# Patient Record
Sex: Male | Born: 1970 | Race: White | Hispanic: No | Smoking: Never smoker
Health system: Southern US, Community
[De-identification: ages and names within clinical notes are randomized; demographics above are authoritative.]

## PROBLEM LIST (undated history)

## (undated) DIAGNOSIS — F1120 Opioid dependence, uncomplicated: Secondary | ICD-10-CM

## (undated) DIAGNOSIS — J449 Chronic obstructive pulmonary disease, unspecified: Secondary | ICD-10-CM

## (undated) DIAGNOSIS — T148XXA Other injury of unspecified body region, initial encounter: Secondary | ICD-10-CM

## (undated) DIAGNOSIS — F17200 Nicotine dependence, unspecified, uncomplicated: Secondary | ICD-10-CM

## (undated) DIAGNOSIS — G43909 Migraine, unspecified, not intractable, without status migrainosus: Secondary | ICD-10-CM

## (undated) DIAGNOSIS — F1121 Opioid dependence, in remission: Secondary | ICD-10-CM

## (undated) DIAGNOSIS — Z6841 Body Mass Index (BMI) 40.0 and over, adult: Secondary | ICD-10-CM

## (undated) DIAGNOSIS — I10 Essential (primary) hypertension: Secondary | ICD-10-CM

## (undated) DIAGNOSIS — I890 Lymphedema, not elsewhere classified: Secondary | ICD-10-CM

## (undated) HISTORY — PX: APPENDECTOMY: SHX54

## (undated) HISTORY — PX: LEG SURGERY: SHX1003

---

## 2000-02-15 ENCOUNTER — Emergency Department (HOSPITAL_COMMUNITY): Admission: EM | Admit: 2000-02-15 | Discharge: 2000-02-15 | Payer: Self-pay | Admitting: Emergency Medicine

## 2000-02-15 ENCOUNTER — Encounter: Payer: Self-pay | Admitting: Emergency Medicine

## 2000-08-27 ENCOUNTER — Emergency Department (HOSPITAL_COMMUNITY): Admission: EM | Admit: 2000-08-27 | Discharge: 2000-08-27 | Payer: Self-pay | Admitting: Emergency Medicine

## 2000-08-28 ENCOUNTER — Encounter: Payer: Self-pay | Admitting: Emergency Medicine

## 2000-08-30 ENCOUNTER — Emergency Department (HOSPITAL_COMMUNITY): Admission: EM | Admit: 2000-08-30 | Discharge: 2000-08-30 | Payer: Self-pay | Admitting: Emergency Medicine

## 2001-01-02 ENCOUNTER — Emergency Department (HOSPITAL_COMMUNITY): Admission: EM | Admit: 2001-01-02 | Discharge: 2001-01-02 | Payer: Self-pay | Admitting: Emergency Medicine

## 2001-01-02 ENCOUNTER — Encounter: Payer: Self-pay | Admitting: Emergency Medicine

## 2001-01-06 ENCOUNTER — Emergency Department (HOSPITAL_COMMUNITY): Admission: EM | Admit: 2001-01-06 | Discharge: 2001-01-06 | Payer: Self-pay | Admitting: Emergency Medicine

## 2001-01-12 ENCOUNTER — Encounter: Payer: Self-pay | Admitting: Emergency Medicine

## 2001-01-12 ENCOUNTER — Emergency Department (HOSPITAL_COMMUNITY): Admission: EM | Admit: 2001-01-12 | Discharge: 2001-01-12 | Payer: Self-pay | Admitting: Emergency Medicine

## 2001-02-06 ENCOUNTER — Emergency Department (HOSPITAL_COMMUNITY): Admission: EM | Admit: 2001-02-06 | Discharge: 2001-02-06 | Payer: Self-pay | Admitting: Emergency Medicine

## 2001-03-21 ENCOUNTER — Emergency Department (HOSPITAL_COMMUNITY): Admission: EM | Admit: 2001-03-21 | Discharge: 2001-03-22 | Payer: Self-pay | Admitting: Emergency Medicine

## 2001-03-22 ENCOUNTER — Encounter: Payer: Self-pay | Admitting: Emergency Medicine

## 2001-04-08 ENCOUNTER — Emergency Department (HOSPITAL_COMMUNITY): Admission: EM | Admit: 2001-04-08 | Discharge: 2001-04-08 | Payer: Self-pay | Admitting: *Deleted

## 2001-04-11 ENCOUNTER — Emergency Department (HOSPITAL_COMMUNITY): Admission: EM | Admit: 2001-04-11 | Discharge: 2001-04-11 | Payer: Self-pay | Admitting: Emergency Medicine

## 2001-04-11 ENCOUNTER — Encounter: Payer: Self-pay | Admitting: Emergency Medicine

## 2001-04-14 ENCOUNTER — Ambulatory Visit (HOSPITAL_BASED_OUTPATIENT_CLINIC_OR_DEPARTMENT_OTHER): Admission: RE | Admit: 2001-04-14 | Discharge: 2001-04-14 | Payer: Self-pay | Admitting: Orthopedic Surgery

## 2002-12-31 ENCOUNTER — Emergency Department (HOSPITAL_COMMUNITY): Admission: AD | Admit: 2002-12-31 | Discharge: 2002-12-31 | Payer: Self-pay | Admitting: Family Medicine

## 2003-04-23 ENCOUNTER — Emergency Department (HOSPITAL_COMMUNITY): Admission: EM | Admit: 2003-04-23 | Discharge: 2003-04-23 | Payer: Self-pay | Admitting: Emergency Medicine

## 2003-07-04 ENCOUNTER — Inpatient Hospital Stay (HOSPITAL_COMMUNITY): Admission: EM | Admit: 2003-07-04 | Discharge: 2003-07-05 | Payer: Self-pay | Admitting: Emergency Medicine

## 2003-09-30 ENCOUNTER — Emergency Department (HOSPITAL_COMMUNITY): Admission: EM | Admit: 2003-09-30 | Discharge: 2003-09-30 | Payer: Self-pay | Admitting: Emergency Medicine

## 2003-10-31 ENCOUNTER — Emergency Department (HOSPITAL_COMMUNITY): Admission: EM | Admit: 2003-10-31 | Discharge: 2003-11-01 | Payer: Self-pay | Admitting: *Deleted

## 2003-11-12 ENCOUNTER — Emergency Department (HOSPITAL_COMMUNITY): Admission: EM | Admit: 2003-11-12 | Discharge: 2003-11-12 | Payer: Self-pay | Admitting: Emergency Medicine

## 2003-11-29 ENCOUNTER — Emergency Department (HOSPITAL_COMMUNITY): Admission: EM | Admit: 2003-11-29 | Discharge: 2003-11-29 | Payer: Self-pay | Admitting: Family Medicine

## 2003-12-31 ENCOUNTER — Emergency Department (HOSPITAL_COMMUNITY): Admission: EM | Admit: 2003-12-31 | Discharge: 2003-12-31 | Payer: Self-pay | Admitting: Emergency Medicine

## 2004-05-20 ENCOUNTER — Emergency Department (HOSPITAL_COMMUNITY): Admission: EM | Admit: 2004-05-20 | Discharge: 2004-05-20 | Payer: Self-pay | Admitting: Family Medicine

## 2004-06-02 ENCOUNTER — Emergency Department (HOSPITAL_COMMUNITY): Admission: EM | Admit: 2004-06-02 | Discharge: 2004-06-02 | Payer: Self-pay | Admitting: Family Medicine

## 2004-07-28 ENCOUNTER — Emergency Department (HOSPITAL_COMMUNITY): Admission: EM | Admit: 2004-07-28 | Discharge: 2004-07-28 | Payer: Self-pay | Admitting: Emergency Medicine

## 2004-07-29 ENCOUNTER — Emergency Department (HOSPITAL_COMMUNITY): Admission: EM | Admit: 2004-07-29 | Discharge: 2004-07-29 | Payer: Self-pay | Admitting: Emergency Medicine

## 2004-08-24 ENCOUNTER — Emergency Department (HOSPITAL_COMMUNITY): Admission: EM | Admit: 2004-08-24 | Discharge: 2004-08-24 | Payer: Self-pay | Admitting: Emergency Medicine

## 2004-09-03 ENCOUNTER — Emergency Department (HOSPITAL_COMMUNITY): Admission: EM | Admit: 2004-09-03 | Discharge: 2004-09-03 | Payer: Self-pay | Admitting: Emergency Medicine

## 2004-11-25 ENCOUNTER — Emergency Department (HOSPITAL_COMMUNITY): Admission: EM | Admit: 2004-11-25 | Discharge: 2004-11-25 | Payer: Self-pay | Admitting: Emergency Medicine

## 2005-01-06 ENCOUNTER — Emergency Department (HOSPITAL_COMMUNITY): Admission: EM | Admit: 2005-01-06 | Discharge: 2005-01-06 | Payer: Self-pay | Admitting: Emergency Medicine

## 2005-04-16 ENCOUNTER — Emergency Department (HOSPITAL_COMMUNITY): Admission: EM | Admit: 2005-04-16 | Discharge: 2005-04-16 | Payer: Self-pay | Admitting: Emergency Medicine

## 2005-04-29 ENCOUNTER — Emergency Department (HOSPITAL_COMMUNITY): Admission: EM | Admit: 2005-04-29 | Discharge: 2005-04-29 | Payer: Self-pay | Admitting: Pediatrics

## 2005-05-24 ENCOUNTER — Emergency Department (HOSPITAL_COMMUNITY): Admission: EM | Admit: 2005-05-24 | Discharge: 2005-05-24 | Payer: Self-pay | Admitting: Emergency Medicine

## 2006-04-22 ENCOUNTER — Emergency Department (HOSPITAL_COMMUNITY): Admission: EM | Admit: 2006-04-22 | Discharge: 2006-04-22 | Payer: Self-pay | Admitting: *Deleted

## 2006-05-10 ENCOUNTER — Emergency Department (HOSPITAL_COMMUNITY): Admission: EM | Admit: 2006-05-10 | Discharge: 2006-05-10 | Payer: Self-pay | Admitting: Emergency Medicine

## 2006-05-18 ENCOUNTER — Emergency Department (HOSPITAL_COMMUNITY): Admission: EM | Admit: 2006-05-18 | Discharge: 2006-05-18 | Payer: Self-pay | Admitting: Emergency Medicine

## 2006-07-19 ENCOUNTER — Emergency Department (HOSPITAL_COMMUNITY): Admission: EM | Admit: 2006-07-19 | Discharge: 2006-07-20 | Payer: Self-pay | Admitting: Emergency Medicine

## 2006-07-31 ENCOUNTER — Inpatient Hospital Stay (HOSPITAL_COMMUNITY): Admission: EM | Admit: 2006-07-31 | Discharge: 2006-08-03 | Payer: Self-pay | Admitting: Gastroenterology

## 2006-08-03 ENCOUNTER — Encounter (INDEPENDENT_AMBULATORY_CARE_PROVIDER_SITE_OTHER): Payer: Self-pay | Admitting: Internal Medicine

## 2006-09-30 ENCOUNTER — Emergency Department (HOSPITAL_COMMUNITY): Admission: EM | Admit: 2006-09-30 | Discharge: 2006-10-01 | Payer: Self-pay | Admitting: Emergency Medicine

## 2007-01-07 ENCOUNTER — Emergency Department (HOSPITAL_COMMUNITY): Admission: EM | Admit: 2007-01-07 | Discharge: 2007-01-07 | Payer: Self-pay | Admitting: Emergency Medicine

## 2007-04-24 IMAGING — CR DG CHEST 2V
2 series · 2 of 2 positions shown · non-contrast
Comparison: 11/29/03.

CLINICAL DATA: Cough.  
 CHEST - 2 VIEWS:

[w chest pa]
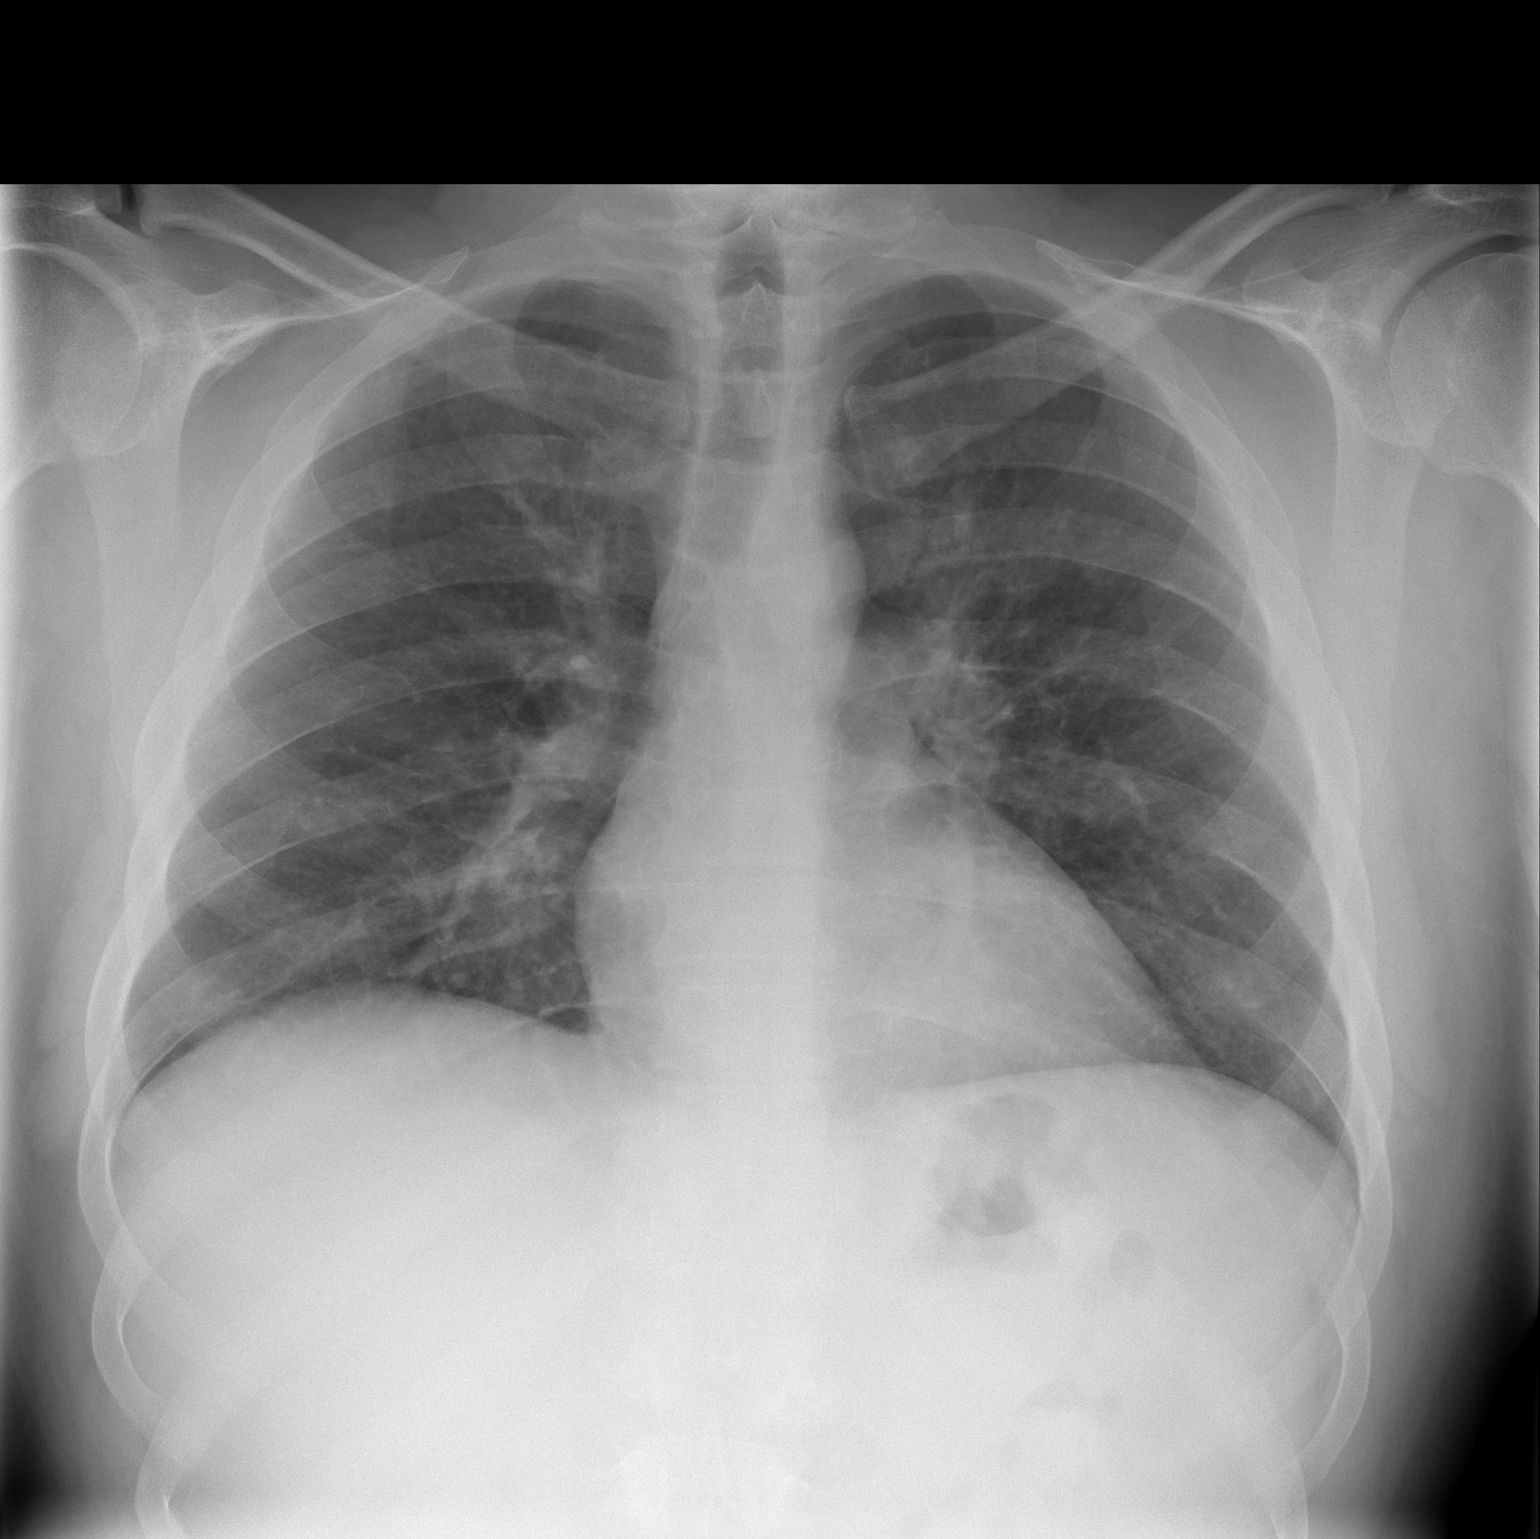

[w chest lat]
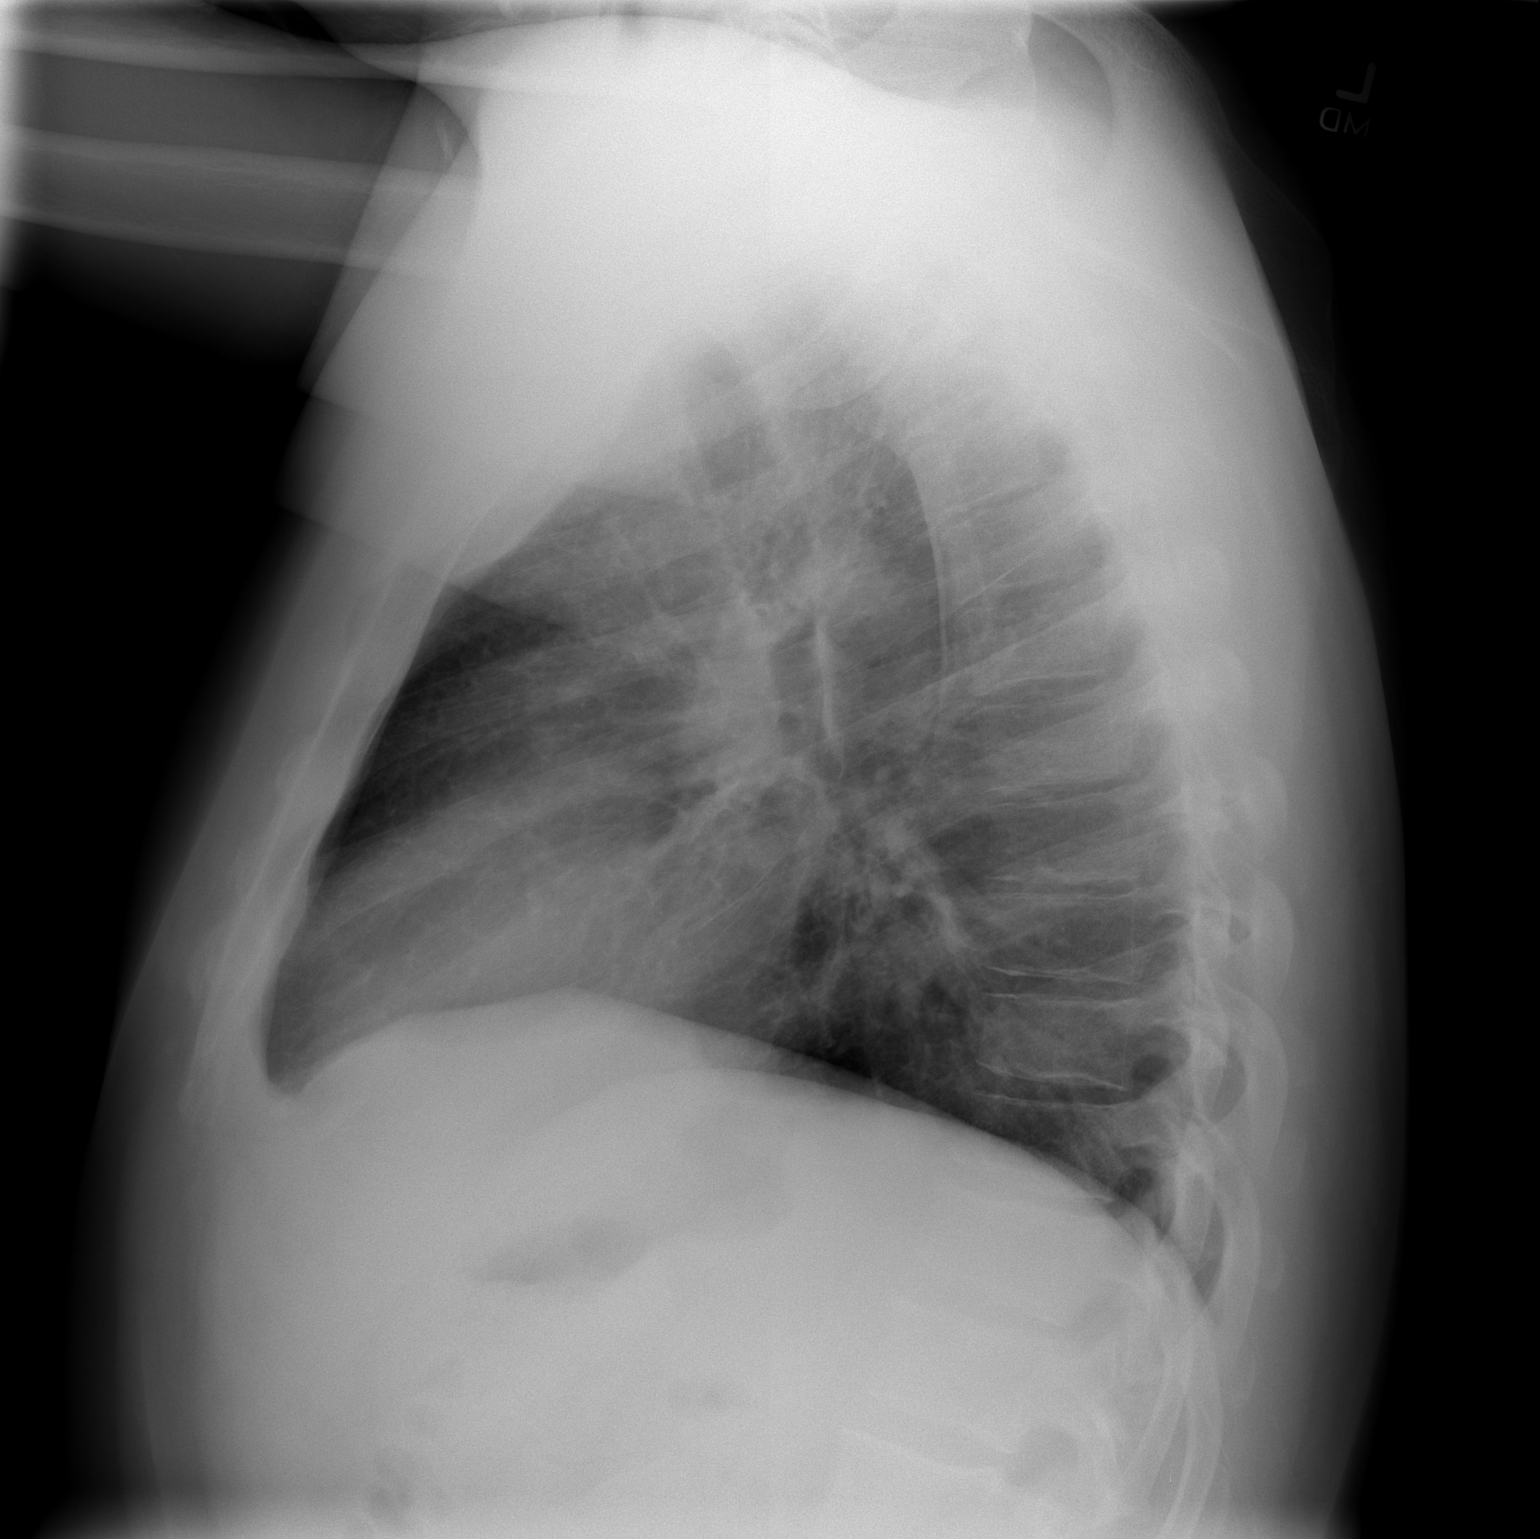

[2 of 2 positions shown; findings below may reference images not displayed]

FINDINGS: Left perihilar patchy densities are seen extending into the left upper lobe.  The lungs are otherwise clear and under inflated. The heart is normal in size.  No pneumothoraces or effusions are seen.
IMPRESSION: Left upper lobe pneumonia.  Follow-up until resolution is confirmed.

## 2007-04-28 ENCOUNTER — Emergency Department (HOSPITAL_COMMUNITY): Admission: EM | Admit: 2007-04-28 | Discharge: 2007-04-28 | Payer: Self-pay | Admitting: Emergency Medicine

## 2007-05-19 ENCOUNTER — Emergency Department (HOSPITAL_COMMUNITY): Admission: EM | Admit: 2007-05-19 | Discharge: 2007-05-19 | Payer: Self-pay | Admitting: Emergency Medicine

## 2007-05-23 ENCOUNTER — Inpatient Hospital Stay (HOSPITAL_COMMUNITY): Admission: EM | Admit: 2007-05-23 | Discharge: 2007-05-24 | Payer: Self-pay | Admitting: Emergency Medicine

## 2007-07-16 ENCOUNTER — Emergency Department (HOSPITAL_COMMUNITY): Admission: EM | Admit: 2007-07-16 | Discharge: 2007-07-16 | Payer: Self-pay | Admitting: Emergency Medicine

## 2007-10-16 ENCOUNTER — Emergency Department (HOSPITAL_COMMUNITY): Admission: EM | Admit: 2007-10-16 | Discharge: 2007-10-16 | Payer: Self-pay | Admitting: Podiatry

## 2007-12-09 ENCOUNTER — Emergency Department (HOSPITAL_COMMUNITY): Admission: EM | Admit: 2007-12-09 | Discharge: 2007-12-09 | Payer: Self-pay | Admitting: Emergency Medicine

## 2008-01-11 IMAGING — CT CT PELVIS W/O CM
2 of 4 series · 13 of 32 positions shown, 18 images · IV contrast (agent unspecified)
Comparison: 09/03/04.

CLINICAL DATA: Sudden onset of left flank, back and groin pain.
ABDOMEN CT WITHOUT CONTRAST:
TECHNIQUE: Multidetector CT imaging of the abdomen was performed following the standard protocol without IV contrast.  Kidney stone protocol with no oral or IV contrast.
TECHNIQUE: Multidetector CT imaging of the pelvis was performed following the standard protocol without IV contrast.

[Series 2: renal stone · axial · 0.70mm/px · z∈[-400,-110]mm · 5 of 88 slices shown, 10 images]
[im 15/88  soft-tissue]
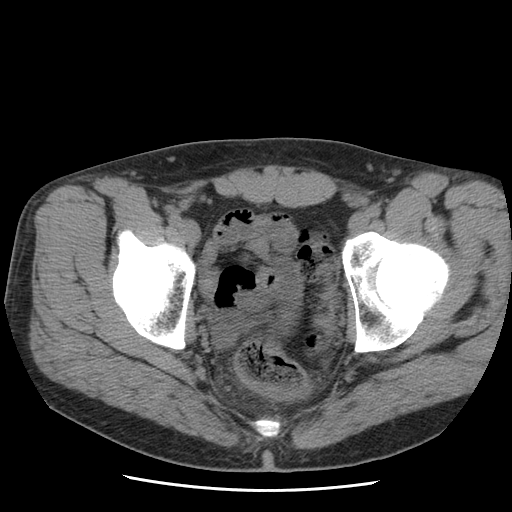
[im 15/88  bone]
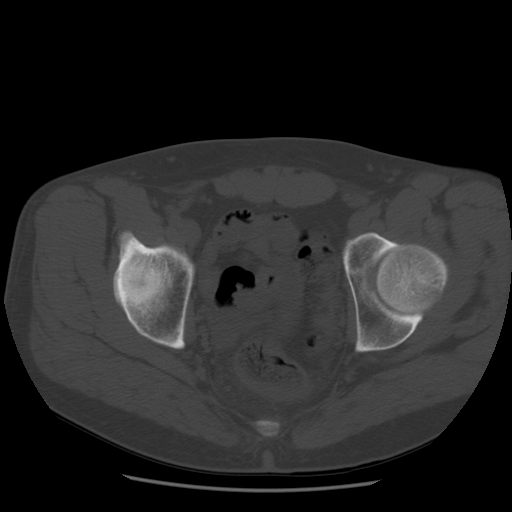
[im 30/88  soft-tissue]
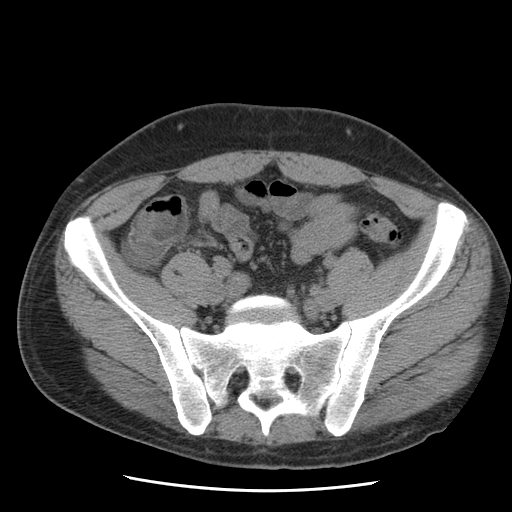
[im 30/88  lung]
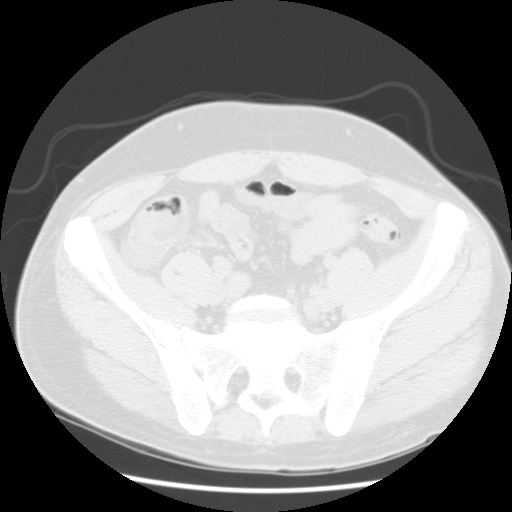
[im 44/88  soft-tissue]
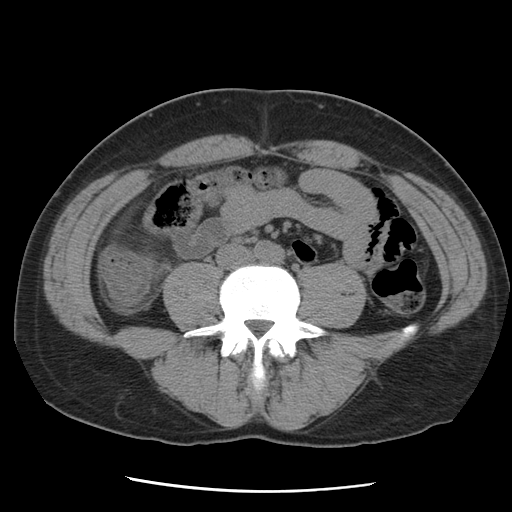
[im 44/88  lung]
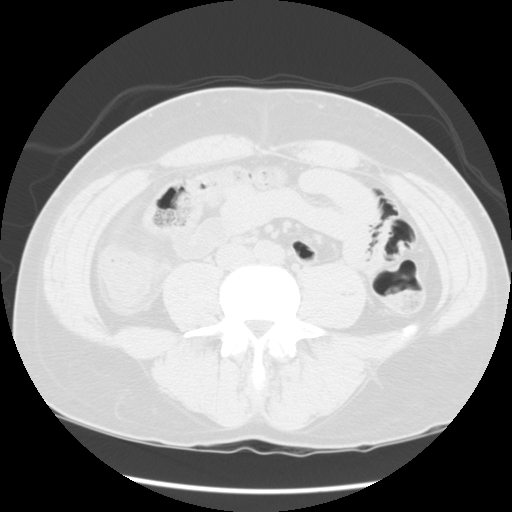
[im 59/88  soft-tissue]
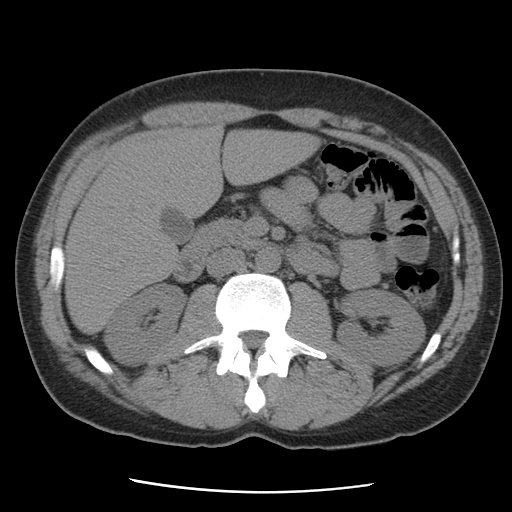
[im 59/88  lung]
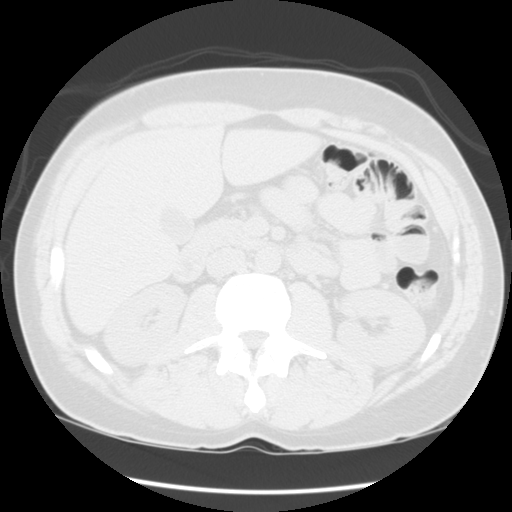
[im 73/88  soft-tissue]
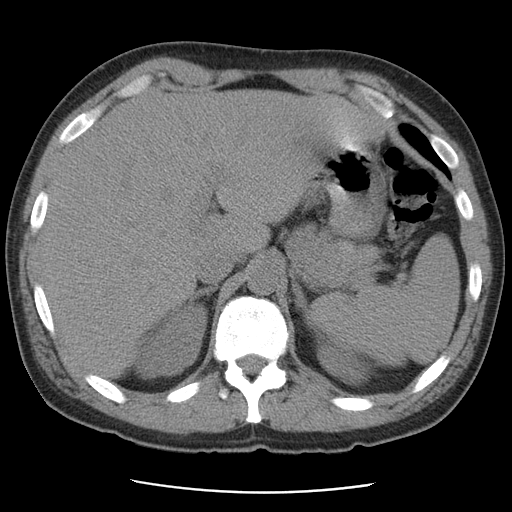
[im 73/88  lung]
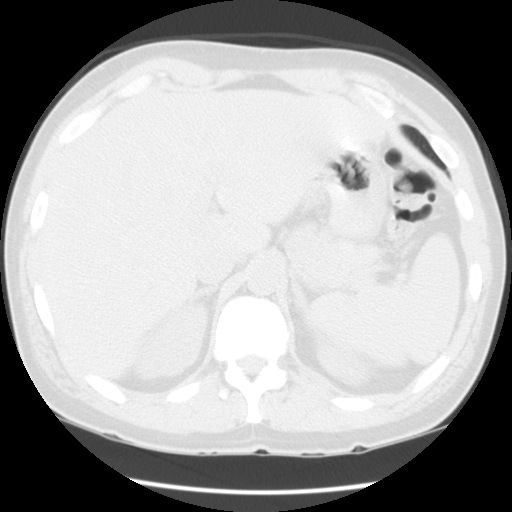

[Series 103: reformatted · sagittal · 0.94mm/px · 8 of 151 slices shown]
[im 14/151  soft-tissue]
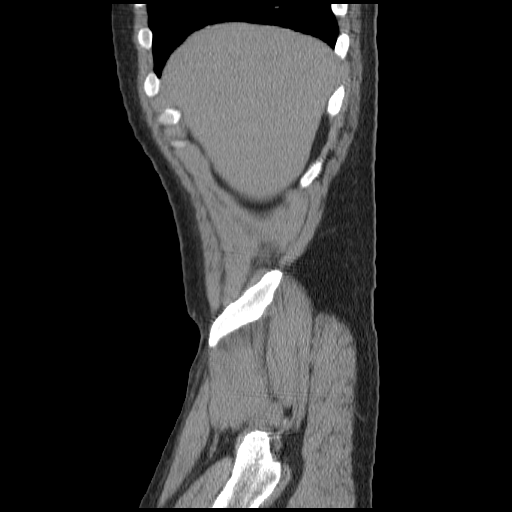
[im 28/151  soft-tissue]
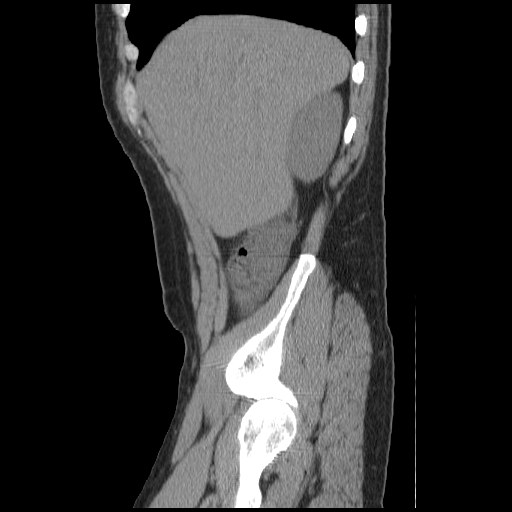
[im 55/151  soft-tissue]
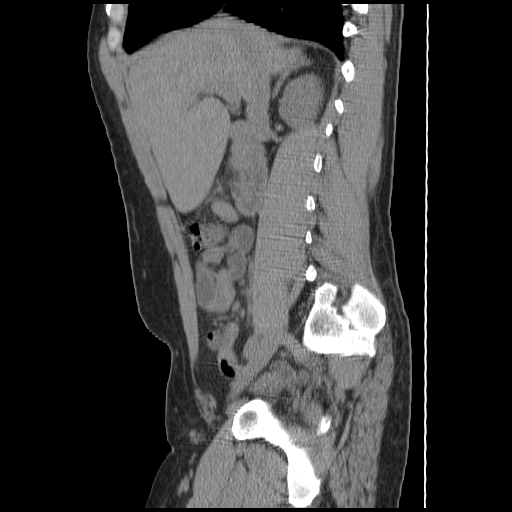
[im 69/151  soft-tissue]
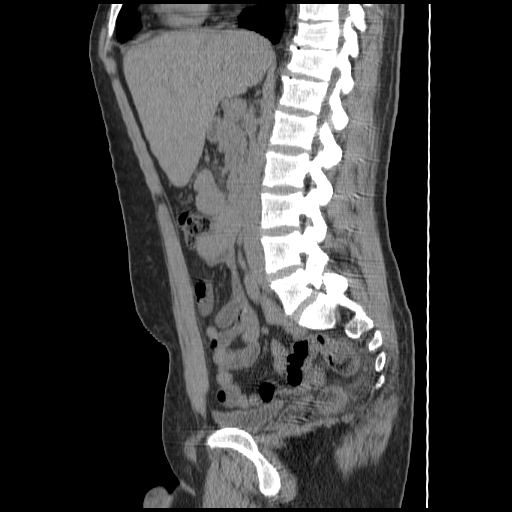
[im 82/151  soft-tissue]
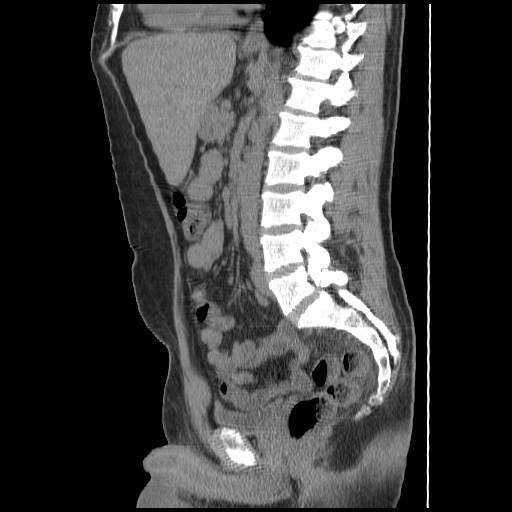
[im 96/151  soft-tissue]
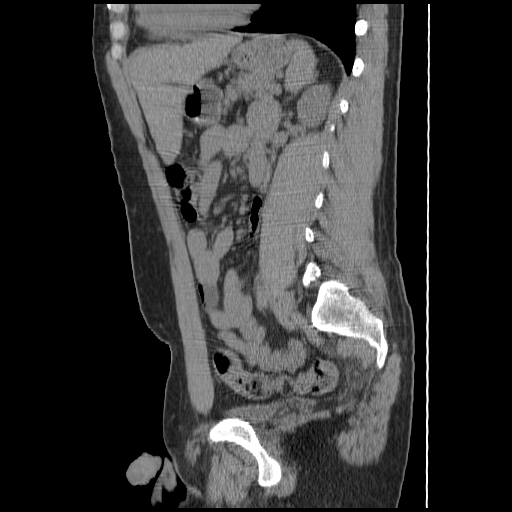
[im 123/151  soft-tissue]
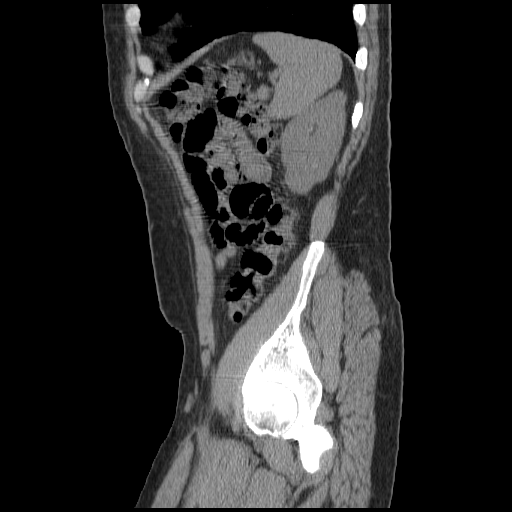
[im 137/151  soft-tissue]
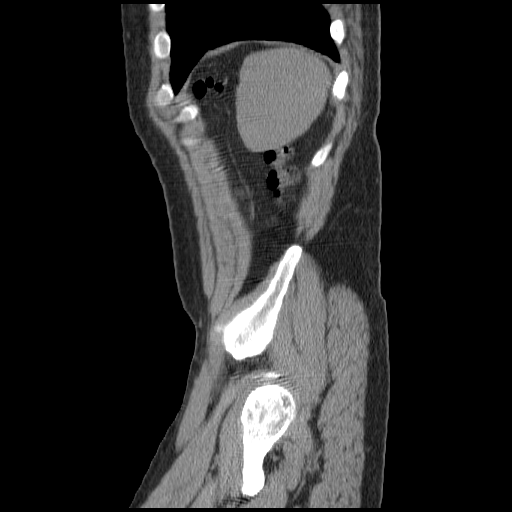

[13 of 32 positions shown; findings below may reference images not displayed]

FINDINGS: There are no renal calculi.  No hydronephrosis or perinephric stranding to suggest obstructive uropathy.  The left ureter is slightly more prominent than the right, and is more prominent than it was in September 2004, but this is a soft finding since the ureter can change in caliber due to peristalsis.  
There is some thickening and stranding around the colon, cecum and ascending colon.  This will be described in more detail in the exam of the pelvis.  No calcified gallstones.  Other viscera are unremarkable given the limitations of scanning without oral or IV contrast.
IMPRESSION: 1.  No evidence for urinary tract calculi or acute obstruction.  The left ureter is slightly full compared to the right.
2.  There does appear to be some inflammation of the right colon.  See report below.
PELVIS CT WITHOUT CONTRAST:
FINDINGS: No ureteral stone or obstruction.  No bladder calculi.  The mild fullness of the left ureter raises the question as to whether the patient may have passed a stone, but there are no current obstructing stones.  No inguinal hernia or adenopathy.
There is wall thickening and inflammation around the cecum and ascending colon suggesting inflammatory bowel disease.  It is possible that this is an artifact because there is no contrast in the bowel, but that would not explain the stranding of the pericolonic fat.  I do not see inflammatory changes in the remainder of the colon, but I would certainly question if this patient has inflammatory bowel disease of the right colon.  This needs careful clinical correlation.  It would appear to be an incidental finding, in light of the patient's left sided symptoms.
IMPRESSION: 1.  Mild fullness in the left ureter without visible stones.  Cannot rule out recent passage of a stone.  
2.  Inflammatory changes of the cecum and ascending colon ? see report.

## 2008-01-17 ENCOUNTER — Inpatient Hospital Stay (HOSPITAL_COMMUNITY): Admission: EM | Admit: 2008-01-17 | Discharge: 2008-01-19 | Payer: Self-pay | Admitting: Internal Medicine

## 2008-01-17 ENCOUNTER — Encounter: Payer: Self-pay | Admitting: Emergency Medicine

## 2008-01-17 ENCOUNTER — Ambulatory Visit: Payer: Self-pay | Admitting: Diagnostic Radiology

## 2008-02-06 ENCOUNTER — Emergency Department (HOSPITAL_BASED_OUTPATIENT_CLINIC_OR_DEPARTMENT_OTHER): Admission: EM | Admit: 2008-02-06 | Discharge: 2008-02-06 | Payer: Self-pay | Admitting: Emergency Medicine

## 2008-04-17 ENCOUNTER — Emergency Department (HOSPITAL_COMMUNITY): Admission: EM | Admit: 2008-04-17 | Discharge: 2008-04-17 | Payer: Self-pay | Admitting: Emergency Medicine

## 2008-05-02 ENCOUNTER — Emergency Department (HOSPITAL_BASED_OUTPATIENT_CLINIC_OR_DEPARTMENT_OTHER): Admission: EM | Admit: 2008-05-02 | Discharge: 2008-05-02 | Payer: Self-pay | Admitting: Emergency Medicine

## 2008-05-02 ENCOUNTER — Ambulatory Visit: Payer: Self-pay | Admitting: Interventional Radiology

## 2008-05-12 ENCOUNTER — Emergency Department (HOSPITAL_COMMUNITY): Admission: EM | Admit: 2008-05-12 | Discharge: 2008-05-12 | Payer: Self-pay | Admitting: Emergency Medicine

## 2008-05-25 ENCOUNTER — Emergency Department (HOSPITAL_COMMUNITY): Admission: EM | Admit: 2008-05-25 | Discharge: 2008-05-25 | Payer: Self-pay | Admitting: Emergency Medicine

## 2008-06-03 ENCOUNTER — Emergency Department (HOSPITAL_COMMUNITY): Admission: EM | Admit: 2008-06-03 | Discharge: 2008-06-03 | Payer: Self-pay | Admitting: Emergency Medicine

## 2008-06-05 ENCOUNTER — Emergency Department (HOSPITAL_COMMUNITY): Admission: EM | Admit: 2008-06-05 | Discharge: 2008-06-05 | Payer: Self-pay | Admitting: Emergency Medicine

## 2008-08-09 ENCOUNTER — Emergency Department (HOSPITAL_COMMUNITY): Admission: EM | Admit: 2008-08-09 | Discharge: 2008-08-10 | Payer: Self-pay | Admitting: Emergency Medicine

## 2008-09-03 ENCOUNTER — Emergency Department (HOSPITAL_COMMUNITY): Admission: EM | Admit: 2008-09-03 | Discharge: 2008-09-03 | Payer: Self-pay | Admitting: Emergency Medicine

## 2008-10-17 ENCOUNTER — Emergency Department (HOSPITAL_COMMUNITY): Admission: EM | Admit: 2008-10-17 | Discharge: 2008-10-17 | Payer: Self-pay | Admitting: Internal Medicine

## 2008-10-24 ENCOUNTER — Emergency Department (HOSPITAL_COMMUNITY): Admission: EM | Admit: 2008-10-24 | Discharge: 2008-10-24 | Payer: Self-pay | Admitting: Emergency Medicine

## 2010-05-14 LAB — URINALYSIS, ROUTINE W REFLEX MICROSCOPIC
Bilirubin Urine: NEGATIVE
Hgb urine dipstick: NEGATIVE
Ketones, ur: NEGATIVE mg/dL
Nitrite: NEGATIVE
Specific Gravity, Urine: 1.029 (ref 1.005–1.030)
Urobilinogen, UA: 0.2 mg/dL (ref 0.0–1.0)

## 2010-05-14 LAB — COMPREHENSIVE METABOLIC PANEL
ALT: 21 U/L (ref 0–53)
AST: 30 U/L (ref 0–37)
Albumin: 3.9 g/dL (ref 3.5–5.2)
Alkaline Phosphatase: 68 U/L (ref 39–117)
BUN: 23 mg/dL (ref 6–23)
Chloride: 110 mEq/L (ref 96–112)
Potassium: 4.2 mEq/L (ref 3.5–5.1)
Sodium: 143 mEq/L (ref 135–145)
Total Bilirubin: 0.4 mg/dL (ref 0.3–1.2)
Total Protein: 6.4 g/dL (ref 6.0–8.3)

## 2010-05-14 LAB — CBC
HCT: 37.2 % — ABNORMAL LOW (ref 39.0–52.0)
Hemoglobin: 12.2 g/dL — ABNORMAL LOW (ref 13.0–17.0)
MCHC: 34.1 g/dL (ref 30.0–36.0)
Platelets: 276 10*3/uL (ref 150–400)
RBC: 3.68 MIL/uL — ABNORMAL LOW (ref 4.22–5.81)
RDW: 13.9 % (ref 11.5–15.5)
RDW: 14.3 % (ref 11.5–15.5)
WBC: 12.2 10*3/uL — ABNORMAL HIGH (ref 4.0–10.5)

## 2010-05-14 LAB — RAPID URINE DRUG SCREEN, HOSP PERFORMED
Amphetamines: NOT DETECTED
Barbiturates: NOT DETECTED
Benzodiazepines: POSITIVE — AB
Tetrahydrocannabinol: NOT DETECTED

## 2010-05-14 LAB — POCT I-STAT, CHEM 8
BUN: 13 mg/dL (ref 6–23)
Chloride: 104 mEq/L (ref 96–112)
HCT: 37 % — ABNORMAL LOW (ref 39.0–52.0)
Potassium: 3.7 mEq/L (ref 3.5–5.1)

## 2010-05-14 LAB — DIFFERENTIAL
Basophils Absolute: 0 10*3/uL (ref 0.0–0.1)
Basophils Absolute: 0.1 10*3/uL (ref 0.0–0.1)
Basophils Relative: 0 % (ref 0–1)
Basophils Relative: 1 % (ref 0–1)
Eosinophils Absolute: 0 10*3/uL (ref 0.0–0.7)
Eosinophils Relative: 0 % (ref 0–5)
Lymphocytes Relative: 14 % (ref 12–46)
Monocytes Absolute: 0.1 10*3/uL (ref 0.1–1.0)
Monocytes Relative: 1 % — ABNORMAL LOW (ref 3–12)
Monocytes Relative: 8 % (ref 3–12)
Neutro Abs: 11.7 10*3/uL — ABNORMAL HIGH (ref 1.7–7.7)
Neutro Abs: 9.5 10*3/uL — ABNORMAL HIGH (ref 1.7–7.7)
Neutrophils Relative %: 77 % (ref 43–77)

## 2010-05-14 LAB — ETHANOL: Alcohol, Ethyl (B): <5 mg/dL (ref 0–10)

## 2010-06-18 NOTE — Discharge Summary (Signed)
NAMEHERMENEGILDO, CLAUSEN                 ACCOUNT NO.:  [id number]   MEDICAL RECORD NO.:  [id number]          PATIENT TYPE:  INP   LOCATION:  1438                         FACILITY:  [facility]   PHYSICIAN:  Altha Harm, MDDATE OF BIRTH:  Dec 19, 1970   DATE OF ADMISSION:  05/23/2007  DATE OF DISCHARGE:  05/24/2007                               DISCHARGE SUMMARY   DISCHARGE DISPOSITION:  Home.   FINAL DISCHARGE DIAGNOSES:  1. Community-acquired pneumonia possibly atypical.  2. Chronic pain.  3. Narcotic abuse.  4. Ground glass opacities in the lungs suggestive of atypical      pneumonia.  5. Stage 1 hypertension.   DISCHARGE MEDICATIONS:  1. Avelox 400 mg p.o. daily.  2. Fentanyl transdermal 15 mcg transdermally q.72 h.  3. Guaifenesin 600 mg p.o. b.i.d. p.r.n.  4. Albuterol MDI 2 puffs inhaled p.o. q.2 h. p.r.n.  5. Atrovent MDI 2 puffs q.4 h.   CONSULTATIONS:  None.   PROCEDURES:  None.   DIAGNOSTIC STUDIES:  Chest x-ray 2 view which shows bilateral pneumonia.  Impression:  Follow up imaging recommended to ensure complete  resolution.   STUDIES:  CT chest without contrast which shows multifocal bilateral  patchy ground-glass densities.  The appearance favors atypical  infection.  If this patient is immunocompromised this may represent PCP  pneumonia or other atypical etiologies.  Careful clinical correlation is  suggested.  Bacterial pneumonia is considered less favorable but not  excluded.  Borderline enlarged mediastinal lymph nodes as seen  previously.   ALLERGIES:  TYLENOL NO. 3 which causes hives.   CODE STATUS:  Full code.   PRIMARY CARE PHYSICIAN:  Unassigned.   CHIEF COMPLAINT:  Fevers, chills, and intractable cough.   HOSPITAL COURSE:  1. The patient was admitted and started on IV Avelox in addition to      nebulized beta agonist and Atrovent.  From the beginning of the      hospitalization the patient had no requirement for oxygen      whatsoever.  The  patient has been without oxygen since the entire      hospital stay.  The patient has been ambulatory on the floor of the      hospital without any oxygen and any exercise intolerance.  The      patient's oxygen sats are approximately 94% at rest and 93% with      activity as there is no change in his oxygen requirement or no      relative hypoxia with activity.  The patient has been receiving      nebulizers here in the hospital for wheezing.  However, the patient      has not had very much wheezing here in the hospital.  This can be      treated with albuterol and Atrovent inhalers MDI.  2. Pain.  The patient complains of pain.  He is placed on a fentanyl      patch.  The patient consistently rates his pain as an 8/10.      However, the patient is quite functional with his  pain performing      all his activities, ambulating well without any difficulty, eating      and drinking without any difficulty, and leaving the floor and      leaving the hospital without any difficulty.  Thus regardless of      the patient's level of pain he is quite functional with it, and the      Fentanyl patch appears to be mediating the pain to a level where      the patient can perform his activities without any difficulty.  3. Narcotic abuse.  The patient readily admits to narcotic abuse      stating that he takes methadone and oxycodone which he gets from      the streets.  During this hospitalization there was an incident      where the patient had a Fentanyl patch placed, and the Fentanyl      patch disappeared.  The nurses searched the entire room including      all the bed clothes and were unable to find the Fentanyl patch.      There is a suspicion from the nurses that the Fentanyl patch was      removed by the patient's girlfriend who was in the room with him.      I replaced the patient's Fentanyl patch in the effort to control      his pain, but informed the patient that if the Fentanyl patch       disappeared again he would not have it replaced on this admission      except for the reasonable and requisite time frame for a prescribed      Fentanyl patch.  The patient understood and agreed with the plan.  4. Ground glass opacities on chest x-ray.  The patient had a chest CT      done in August 2008, and there was a recommendation for a followup      three months later.  The patient never had a followup CT, and it      was done here while he was hospitalized.  The CT showed multifocal      bilateral patchy ground glass densities.  No further workup was      pursued on this hospitalization as the patient had no clinical      symptoms correlating to this.  However, I have advised the patient      that he needs to follow up with primary care physician on the      outside to possibly have a referral to pulmonology and to have this      evaluated by lung specialist preferably through bronchoscopy with      bronchoalveolar lavage and possibly biopsy if there is no      resolution of this.   PHYSICAL EXAMINATION:  GENERAL:  The patient's condition this morning is  stable.  He appears well and in no acute distress whatsoever.  VITAL SIGNS:  Temperature 98.7, heart rate 85, O2 sats 93% on room air,  ambulatory, respiratory rate 16, blood pressure 153/77.  LUNGS:  Clear to auscultation.  However, the patient recently received  breathing treatment.  There is no accessory muscle use with the  patient's breathing.  He has equal excursion bilaterally, breathing is  nonlabored.  CARDIOVASCULAR:  He has a normal S1, S2.  No murmurs, rubs or gallops  noted.  PMI is nondisplaced.  No heaves or thrills to palpation.  ABDOMEN:  Soft,  obese, nontender, nondistended.  No masses, no  hepatosplenomegaly noted.  LYMPHADENOPATHY:  There is no cervical, axillary or inguinal  lymphadenopathy noted.   Patient is eating and drinking well and ambulating without any  respiratory distress whatsoever.       Altha Harm, MD  Electronically Signed     MAM/MEDQ  D:  05/24/2007  T:  05/24/2007  Job:  [id number]

## 2010-06-18 NOTE — H&P (Signed)
[name], [name]                 ACCOUNT Nathaniel [name].:  [id number]   MEDICAL RECORD Nathaniel [name].:  [id number]          PATIENT TYPE:  INP   LOCATION:  1305                         FACILITY:  [facility]   PHYSICIAN:  Gardiner Barefoot, MD    DATE OF BIRTH:  December 05, 1970   DATE OF ADMISSION:  07/31/2006  DATE OF DISCHARGE:                              HISTORY & PHYSICAL   PRIMARY CARE PHYSICIAN:  Unassigned.   CHIEF COMPLAINT:  Cough and shortness of breath.   HISTORY OF PRESENT ILLNESS:  Mr. Nathaniel [name] is a 40 year old male with  history of substance abuse and recurrent episodes of shortness of breath  and clinical diagnoses of bronchitis and pneumonia.  The patient has had  frequent visits to the emergency room in regards to these episodes and,  in fact, has recently had a CT scan angio July 20, 2006 significant for  small right effusion and dense opacity at the right base that likely is  an inflammatory process but neoplasm not excluded.  The patient does  endorse a 30 pound weight loss that is not documented by his emergency  room visits but denies any recent night sweats or fevers.  The patient  was most recently treated for pneumonia with azithromycin, however, has  had Nathaniel [name] relief.  The patient's girlfriend does report that he has had  difficulty sleeping, unable to lie down because of shortness of breath.  He does report a nonproductive cough which has been the same over  several weeks.  The patient denies any chest pain and does report a  normal appetite.   PAST MEDICAL HISTORY:  None.   FAMILY HISTORY:  Nathaniel [name] lung disease.   SOCIAL HISTORY:  He smokes 2 packs per day, history of substance abuse  and patient does report he has been through rehab.  He denies any  current drug use.   ALLERGIES:  Nathaniel [name] known drug allergies.   MEDICATIONS:  None.   REVIEW OF SYSTEMS:  A complete 12 point review of systems was obtained  and was negative other than that as presented in the history of present  illness.   PHYSICAL EXAMINATION:  VITAL SIGNS:  Temperature 99.7, pulse 87.  Respirations 24.  Blood pressure is 119/66.  Oxygen saturation 93% on  room air and 96% on 2L nasal cannula.  GENERAL APPEARANCE:  The patient is awake, alert, is drowsy, has  received opioids as well as benzodiazepine in the emergency room.  CHEST:  Clear to auscultation bilaterally with good air entry and Nathaniel [name]  crackles noted.  The patient was examined after a nebulizer treatment.  HEART:  Has a regular rate and rhythm with Nathaniel [name] murmurs, rubs or gallops.  ABDOMEN:  Soft, nontender, nondistended with positive bowel sounds and  Nathaniel [name] hepatosplenomegaly.  EXTREMITIES:  With Nathaniel [name] cyanosis, clubbing or edema.   LABORATORY DATA:  Includes a white count of 13.2, hemoglobin 11.6,  platelet count 296,000, 85% neutrophils.  Sodium is 136, potassium 3.9,  chloride 99, bicarb 27, BUN 8, creatinine 0.72, glucose 86, AST 31, ALT  21.  Urinalysis negative.  ABG's 7.34/49/73/24.  CLINICAL DATA:  Chest x-ray with progressive perihilar and upper lobe  air space disease, pulmonary edema versus infection.  Right lower lobe  abnormality and adenopathy not well visualized on the chest x-ray.   ASSESSMENT/PLAN:  1. Hypoxia.  The patient does have mild hypoxia.  I am not clear if      this is pneumonia at this time as the patient has had a      nonproductive cough and Nathaniel [name] fever and Nathaniel [name] other significant findings      for pneumonia.  Rather, this process may be consistent with either      an early emphysema or chronic obstructive pulmonary disease.  Other      processes such as sarcoid disease, lung cancer or less likely      interstitial disease.  The patient likely needs to have further      evaluation with a chest CT scan to further evaluate his adenopathy      and potentially evaluation by pulmonology and potentially a      bronchoscopy for an etiology.  An infectious etiology is less      likely although certainly on the differential.  2. History  of substance abuse.  The patient does have a history of      using opioids as well as benzodiazepines and therefore will check a      urine drug screen on the urinalysis that was done prior to the      patient's medications.  Will hold off on any more opioids and will      give periodic doses of Ativan to avoid any withdrawal.  3. Case management.  The patient does not have a primary care      physician and has had multiple visits to the emergency room.  He      will need to be referred for a primary care physician, though he      does not have insurance.      Gardiner Barefoot, MD  Electronically Signed     RWC/MEDQ  D:  07/31/2006  T:  08/01/2006  Job:  [id number]

## 2010-06-18 NOTE — Discharge Summary (Signed)
NAMEGURTEJ, Nathaniel [name]                 ACCOUNT NO.:  [id number]   MEDICAL RECORD NO.:  [id number]          PATIENT TYPE:  INP   LOCATION:  1305                         FACILITY:  [facility]   PHYSICIAN:  Isidor Holts, M.D.  DATE OF BIRTH:  Oct 17, 1970   DATE OF ADMISSION:  07/31/2006  DATE OF DISCHARGE:  08/03/2006                               DISCHARGE SUMMARY   DISCHARGE DIAGNOSES:  1. Community-acquired pneumonia.  2. Chronic obstructive pulmonary disease exacerbation, secondary to #1      above.  3. Smoking history.  4. Mild respiratory failure, secondary to #1, 2 and 3 above.  5. Gastroesophageal reflux disease.  6. Lung abnormality seen on chest CT angiogram July 20, 2006.      Recommended followup in 3 months.   DISCHARGE MEDICATIONS:  1. Avelox 400 mg p.o. daily for 7 days from August 04, 2006.  2. Mucinex 600 mg p.o. b.i.d. for 7 days only.  3. Percocet 5/325 one p.o. p.r.n. q.8h.; a total of 15 pills have been      dispensed.  4. Protonix 40 mg p.o. daily.  5. Combivent inhaler 2 puffs t.i.d.  6. Prednisone 30 mg p.o. daily for 2 days, then 20 mg p.o. daily for 2      days, then 10 mg p.o. daily for 2 days, then stop.   PROCEDURE:  1. Chest x-ray dated July 24, 2006:  This showed progressive      perihilar/upper lobe airspace disease, possibly representing      pulmonary edema versus infectious infiltrate.  2. Chest x-ray dated August 03, 2006:  This showed resolution of      airspace disease in upper lobes.  3. Note, of special interest, chest CT angiogram dated July 20, 2006;      this showed suboptimal filling of the pulmonary arteries but no      obvious pulmonary thromboembolism, mild mediastinal and hilar      adenopathy, small right effusion and density at the right base.      This may simply represent an inflammatory process, although a      neoplasm is not entirely excluded. Followup CT in 3 to 6 months is      warranted.  4. Two-D echocardiogram dated August 03, 2006:  Report was still pending      at the time of this dictation.   CONSULTATIONS:  None.   ADMISSION HISTORY:  As in H&P note of July 31, 2006 dictated by Dr.  Staci Righter. However, in brief, this is a 40 year old male known  smoker, with history of opioid abuse, otherwise no significant past  medical history, who presents with cough and shortness of breath.  On  detailed history, it appears that the patient has had frequent visits to  the emergency department related to episodes of shortness of breath and  cough, and as a matter of fact, had a chest CT angiogram done on July 20, 2006 which shows some abnormal findings. For details, refer to  procedure list above. He represents on this occasion.  Chest x-ray  showed progressive  perihilar and upper lobe airspace disease. The  patient's initial ABGs were 7.34/49/73/49, bicarbonate 24. He was  admitted for the evaluation, investigation and management.   CLINICAL COURSE:  1. Community-acquired pneumonia.  For details of presentation, refer      to admission history above. However, differential diagnostic      considerations for patient's shortness of breath and cough inluded      the possibility of an infective process, i.e. pneumonia, versus      pulmonary edema. However, BNP was 30, effectively ruling out      cardiomyopathy/pulmonary edema. The patient also had cough      productive of brownish/yellowish phlegm. He was managed with      intravenous Avelox, bronchodilator nebulizers and Mucinex with      satisfactory clinical response. By August 03, 2006, the patient felt      very well indeed, had no pyrexia, and sputum production had      significantly diminished. Followup chest x-ray of August 03, 2006      showed complete resolution of upper lobe airspace disease.      Clinically, the patient's problem appeared to have been community-      acquired pneumonia. He was transitioned to oral Avelox and is      expected to complete a  10-day course of antibiotic treatment on      August 10, 2006.   1. Chronic obstructive pulmonary disease exacerbation.  The patient is      a quite heavy smoker. It is clear that COPD is likely contributory      to his shortness of breath. He has been counseled appropriately for      cigarette smoking. Nicoderm CQ patch was utilized during the course      of his hospitalization. He has, however, also been placed on      Combivent MDI and a tapering course of steroids. Clinically, he      responded quite dramatically to above measures.   1. Mild respiratory failure.  Presenting ABGs were as follows:  PH      7.34, pCO2 49, pO2 73, bicarbonate 24. These findings are      consistent with marked chronic respiratory failure secondary to      COPD with supradded community-acquired pneumonia, against      background of smoking history. It is likely that these parameters      will improve, provided that patient stop smoking and is compliant      with his bronchodilator medication. Repeat ABGs were done on August 03, 2006. and showed PH7.453; PCO2 33.0; PO2 79.0 on room air.   1. Gastroesophageal reflux disease.  During the course of his      hospitalization, patient complained of severe heartburn, i.e. on      August 02, 2006. This was readily addressed with protein-pump      inhibitor and p.r.n. Maalox with resolution of symptoms.   1. Smoking history. As mentioned above, the patient has been counseled      for this.   DISPOSITION:  The patient was considered sufficiently clinically  recovered and stable to be discharged in satisfactory condition on August 03, 2006 and has been discharged accordingly. He is also recommended to  return to work on August 10, 2006.   DIET:  No restrictions.   ACTIVITY:  As tolerated.   FOLLOWUP INSTRUCTIONS:  The patient at present has no primary M.D. He has been strongly  recommended to establish a primary M.D. for routine  and preventative care. He has been  supplied with appropriate  information.   SPECIMENS:  The patient is recommended to have followup chest CT scan in  3 months because of abnormality seen on chest CT angiogram on July 20, 2006. For details, refer to procedure list above. All of this has been  communicated to patient. He has verbalized understanding.     Isidor Holts, M.D.  Electronically Signed    CO/MEDQ  D:  08/03/2006  T:  08/03/2006  Job:  [id number]

## 2010-06-18 NOTE — H&P (Signed)
NAMEDAYVEN, LINSLEY NO.:  [id number]   MEDICAL RECORD NO.:  [id number]          PATIENT TYPE:  INP   LOCATION:  1438                         FACILITY:  [facility]   PHYSICIAN:  Lucita Ferrara, MD         DATE OF BIRTH:  [birth year]   DATE OF ADMISSION:  05/22/2007  DATE OF DISCHARGE:                              HISTORY & PHYSICAL   HISTORY OF PRESENT ILLNESS:  The patient a 40 year old Caucasian male  presents to [facility] with chief complaint of fevers,  chills and intractable cough with sputum with marine colored phlegm.  Patient is also wheezing .  The patient, of note, has a history of  recurrent community-acquired pneumonia, smokes two packs per day and has  not quit smoking.  The patient also has a history of lung abnormalities  seen on a CT angio July 20, 2006, recommended follow-up in 3 months.  Otherwise, review of systems is negative.  Although, his girlfriend  tells me the patient abuses prescription medications which he buys from  the street.  Apparently, he takes some 10-20 at a time.   PAST MEDICAL HISTORY:  1. Recurrent pneumonia.  2. Tobacco abuse.  3. Bronchitis and COPD.  4. Recurrent bilateral pneumonia.  5. CT scan finding of a lung abnormality recommended to get follow-up.  6. Polysubstance abuse.  7. History of hypoxemia and mild respiratory failure.   SOCIAL HISTORY:  Smokes two packs per day.  He is a heavy drinker, also  abuses drugs.  He is currently on methadone per his girlfriend.  He  takes street medications that he buys in the pill form.   ALLERGIES:  HE IS ALLERGIC TO CODEINE #3 WHERE HE GETS HIVES.   HOME MEDICATIONS:  Only significant for Vicodin.  He also takes  ibuprofen.   REVIEW OF SYSTEMS:  Otherwise, negative.   PHYSICAL EXAMINATION:  VITAL SIGNS:  Pulse is 93, respirations 18,  temperature 97.1.  HEENT:  Patient is normocephalic, atraumatic.  Sclerae anicteric.  NECK:  Supple, no JVD or carotid bruits.   Mucous membranes are dry.  CARDIOVASCULAR:  S1, S2, regular rate and rhythm, no murmurs, rubs,  clicks.  LUNGS:  Bilateral expiratory wheezes.  EXTREMITIES:  No clubbing, cyanosis or edema.  Patient is alert and  oriented x3.  Nerves II-XII grossly intact.   LABORATORY DATA:  He has a basic metabolic panel which is within normal  limits.  CBC shows white count of 15.6.  Chest x-ray shows bilateral  upper lobe pneumonia.   ASSESSMENT/PLAN:  A 40 year old with:  1. Bilateral upper lobe pneumonia leukocytosis.  Bilateral upper lobe      pneumonia is really recurrent.  Admit the patient to telemetry      floor.  Will get the ABGs.  Monitor pulse oximetry.  Start      antibiotics for community-acquired pneumonia coverage with      Zithromax and Rocephin.  Blood cultures.  Will go ahead and proceed      to repeat the CT scan of the chest to evaluate  the right-sided      density that the patient has had in the past.  Nebulizer treatments      of albuterol and Atrovent.  I will go ahead and place a PPD on him,      given recurrance and upper lobe prodominance.  The patient may need      a pulmonary medicine consultation during this admission, although I      do believe that most of his abnormalities in current disease state      is secondary to his tobacco abuse.  2. Polysubstance abuse mostly with opiate narcotics.  Will go ahead      and check a urine drug screen and blood alcohol level.  Will      definitely have to watch for withdrawal.  The patient states that      he takes methadone about 30 mg a day, and he takes 100 mg of street      oxycodone.  I will go ahead and put him on a fentanyl patch for now      to avoid withdrawal.  The patient actually states that he currently      is not drinking.  3. Gastroesophageal reflux disease.  Continue Protonix.  4. Smoking cessation counsling and nicotine patch.  5. Again, case management and referral to substance abuse counselor      during  this admission is prudent.      Lucita Ferrara, MD  Electronically Signed     RR/MEDQ  D:  05/23/2007  T:  05/23/2007  Job:  [id number]

## 2010-06-18 NOTE — H&P (Signed)
NAMEUNKNOWN, SCHLEYER                 ACCOUNT NO.:  [id number]   MEDICAL RECORD NO.:  [id number]          PATIENT TYPE:  INP   LOCATION:  1222                         FACILITY:  [facility]   PHYSICIAN:  Della Goo, M.D. DATE OF BIRTH:  07/09/1970   DATE OF ADMISSION:  01/17/2008  DATE OF DISCHARGE:                              HISTORY & PHYSICAL   PRIMARY CARE PHYSICIAN:  None.   CHIEF COMPLAINT:  Shortness of breath and wheezing.   HISTORY OF THE PRESENT ILLNESS:  This is a 40 year old male who presents  to the emergency department at the [facility] secondary to  complaints of worsening shortness of breath along with wheezing and  productive cough.  The patient reports worsening of his symptoms over  the past few days.  He denies having any fevers or chills.  He reports  coughing up a whitish sputum.  The patient has been admitted on several  occasions for similar symptoms.  He denies having any formal diagnosis  of COPD, emphysema, chronic bronchitis or asthma.  He does report  smoking two packs of cigarettes daily for 25+ years.   [facility]  and was treated with nebulizer treatments continuously without relief.  He was also placed on supplemental oxygen and administered IV steroid  therapy.  The patient was referred for admission secondary to  unremitting symptoms.   PAST MEDICAL HISTORY:  The past medical history significant for:  1. History of respiratory failure in the past.  2. History of bronchitis.  3. Recurrent pneumonia.  4. Tobacco abuse.  5. Polysubstance abuse.   MEDICATIONS:  The patient's medications at this time are none.   ALLERGIES:  THE PATIENT HAS AN ALLERGY TO CODEINE, WHICH CAUSES HIVES.   SOCIAL HISTORY:  The patient is a heavy smoker of two pack daily for 25  years.  Also the patient reports drinking occasionally one beer twice a  week; however, previously had a heavy alcohol  abuse history.  The  patient also has an illicit drug use history and history of  polysubstance abuse, and had attended rehabilitation therapy.  He  reports being clean for several months.   FAMILY HISTORY:  The family history is noncontributory.   REVIEW OF SYSTEMS:  On the review of systems the pertinents are as  mentioned above.  The patient denies having any nausea, vomiting,  diarrhea, constipation, changes in his appetite, weight loss, weight  gain, dysuria, syncope, lightheadedness, and weakness.  He does report  having a cough and chest discomfort associated with increased coughing.   PHYSICAL EXAMINATION:  The physical examination findings are:  GENERAL APPEARANCE:  This is a 40 year old well-nourished and well-  developed male who is agitated and in discomfort, but in no acute  distress.  VITAL SIGNS:  The vital signs are with a temperature of 97.8, blood  pressure 159/105 initially, heart rate 122, respirations 28, and O2 sat  96% on room air.  HEENT EXAMINATION:  Normocephalic and atraumatic head.  There is no  scleral icterus.  Pupils are equally round and react to light.  Extraocular movements are intact.  Funduscopic is benign.  Oropharynx is  clear.  NECK:  The neck is supple with full range of motion.  No thyromegaly,  adenopathy or jugulovenous distention.  HEART:  Cardiovascular - tachycardiac rate and rhythm.  No murmurs,  gallops or rubs.  LUNGS:  The lungs have decreased breath sounds with occasional  expiratory wheezes.  No rales.  Occasional rhonchi present.  ABDOMEN:  The abdomen shows positive bowel sounds, is soft, nontender  and nondistended.  EXTREMITIES:  The extremities are without cyanosis, clubbing or edema.  NEUROLOGIC:  The neurological examination is nonfocal.   LABORATORY STUDIES:  The laboratory studies reveal a white blood cell  count of 7.8, hemoglobin 14.1, hematocrit 41.5 and platelets 279,000  with neutrophils 92% and lymphocytes 6%.   Sodium 142, potassium 4.1,  chloride 106, bicarb 23, BUN 12, creatinine 0.9, and glucose 180.  Alcohol level 21.  Chest x-ray reveals findings consistent with  bronchitis and interstitial pneumonitis without focal infiltrates.   ASSESSMENT:  The patient is a 40 year old male being admitted with:  1. Shortness of breath.  2. Bronchitis, acute versus chronic.  3. Tobacco abuse.  4. Upper respiratory infection.  5. Anxiety.   PLAN:  1. The patient will be admitted to a stepdown ICU area.  2. Nebulizer treatments have been ordered along with a high dose IV      steroid taper.  3. Oxygen therapy has been ordered as needed.  4. The patient will be placed on oral azithromycin therapy to cover      for bronchitis/atypical pneumonia/pneumonitis.  5. A nicotine patch has also been ordered.  6. The patient will be placed on DVT and GI prophylaxes.  7. Further workup will ensue pending the patient's clinical course.      Della Goo, M.D.  Electronically Signed     HJ/MEDQ  D:  01/17/2008  T:  01/18/2008  Job:  [id number]

## 2010-06-18 NOTE — Discharge Summary (Signed)
NAMEJAYLEE, LANTRY                 ACCOUNT NO.:  [id number]   MEDICAL RECORD NO.:  [id number]          PATIENT TYPE:  INP   LOCATION:  1502                         FACILITY:  [facility]   PHYSICIAN:  Hillery Aldo, M.D.   DATE OF BIRTH:  November 02, 1970   DATE OF ADMISSION:  01/17/2008  DATE OF DISCHARGE:  01/19/2008                               DISCHARGE SUMMARY   PRIMARY CARE PHYSICIAN:  None.   DISCHARGE DIAGNOSES:  1. Acute bronchitis with bronchospasm.  2. Steroid-induced hyperglycemia.  3. History of polysubstance abuse.  4. History of alcohol dependency.  5. Tobacco abuse.  6. Mild hyponatremia.  7. Anxiety.   DISCHARGE MEDICATIONS:  1. Azithromycin 250 mg x3 more days.  2. Prednisone Dosepak 60 mg on day one, taper by 10 mg daily until      off.  3. Percocet 5/325 one tablet q.6 hours p.r.n. pain (#20 written for).  4. Valium 5 mg q.8 hours p.r.n. anxiety (#20 written for).  5. Mucinex 600 mg q.12 hours p.r.n.  6. Robitussin DM 2 teaspoons q.4 hours p.r.n.   CONSULTATIONS:  None.   BRIEF ADMISSION HISTORY OF PRESENT ILLNESS:  The patient is a 40-year-  old male who presented to [facility] with complaints of  dyspnea and pleuritic-type chest pain.  Upon initial evaluation in the  emergency department, he was found to have evidence of bronchitis and  interstitial pneumonitis without focal infiltrates and therefore was  admitted for further treatment.  For the full details, please see the  dictated report done by Dr. Lovell Sheehan.   PROCEDURES AND DIAGNOSTIC STUDIES:  1. Chest x-ray on January 17, 2008 showed bronchitis or interstitial      pneumonitis without focal infiltrate.  2. Chest x-ray on January 19, 2008 showed no evidence for pneumonia.      Central airway thickening.   DISCHARGE LABORATORY VALUES:  Sodium was 135, potassium 3.9, chloride  105, bicarb 23, BUN 14, creatinine 0.66, glucose 215.  White blood cell  count was 12.7, hemoglobin 12.8, hematocrit  37.5, platelets 232.   HOSPITAL COURSE BY PROBLEM:  1. Acute bronchitis with bronchospasm:  The patient was admitted and      empirically put on intravenous antibiotics with azithromycin.  He      was put on high-dose Solu-Medrol which was tapered over the course      of his hospital stay.  He was given nebulized bronchodilator      treatments.  He had significant pleuritic-type chest pain and      paroxysms of cough.  His cough was suppressed with Tessalon Perles.      He will be discharged on a prednisone Dosepak and three additional      days of azithromycin.  His white blood cell count is decreasing at      this time and he has remained afebrile.  2. Steroid-induced hyperglycemia:  The patient was put on moderate      scale sliding scale insulin while on intravenous steroids.  His      glycemic control should return to normal once  his steroids are      tapered off.  3. History of polysubstance abuse/alcohol dependency:  The patient was      referred for alcohol drug services on discharge.  4. Tobacco abuse:  The patient was counseled extensively.  He was      provided with a nicotine patch while in the hospital.  5. Mild hyponatremia.  The patient's sodium normalized by discharge.      This is likely due to underlying alcoholism.  6. Anxiety:  The patient was given Valium p.r.n..  His anxiety was      worsened with steroids.  We will give him a limited number of      Valium while he continues on p.o. prednisone.   DISPOSITION:  The patient is medically stable for discharge.  He does  not have a primary care physician and has been provided with the number  for the physician referral line and encouraged to obtain a primary care  physician for outpatient pulmonary function tests once his episode of  bronchitis completely resolves.   Time spent coordinating care for discharge and the discharge  instructions equals 35 minutes.      Hillery Aldo, M.D.  Electronically  Signed     CR/MEDQ  D:  01/19/2008  T:  01/20/2008  Job:  [id number]

## 2010-06-21 NOTE — Op Note (Signed)
[place]  Patient:    Nathaniel [name], Nathaniel [name] Visit Number: [id number] MRN: [id number]          Service Type: DSU Location: [facility] Attending Physician:  Teena Dunk Dictated by:   Sharlot Gowda., M.D. Proc. Date: 04/15/01 Admit Date:  04/14/2001                             Operative Report  PREOPERATIVE DIAGNOSIS: 1. Slightly displaced intra-articular lateral plateau fracture. 2. Partial anterior cruciate ligament tear with mild chondromalacia lateral    plateau.  POSTOPERATIVE DIAGNOSIS: 1. Slightly displaced intra-articular lateral plateau fracture. 2. Partial anterior cruciate ligament tear with mild chondromalacia lateral    plateau.  OPERATION PERFORMED: 1. Open reduction internal fixation of lateral plateau fracture with two 7-0    cannulated screws. 2. Debridement of anterior cruciate ligament and chondromalacia of lateral    compartment.  SURGEON:  Sharlot Gowda., M.D.  ASSISTANT:  Arnoldo Morale, P.A.  TOURNIQUET TIME:  55 minutes.  ANESTHESIA:  INDICATIONS FOR PROCEDURE:  The patient is in his 30s and had a slightly displaced intra-articular lateral plateau fracture thought to be amenable to arthroscopic aid and reduction.  He was advised that his best chance to minimize any traumatic arthritis would be with fixation of the fracture, that this could be accomplished as overnight or as outpatient.  DESCRIPTION OF PROCEDURE:  Arthroscope through superior medial, inferior medial, inferolateral portal.  Care was made not to take the knee under any undue valgus stress.  Medial compartment, suprapatellar area was normal. There was a hemarthrosis present.  There anterior lateral bundle of the ACL was torn.  This appeared to be a partial tear.  This was debrided.  There was mild chondral damage to the articular surface of the tibia but by and large except for a very posterior portion of the articular surface  under the meniscus which was very small, the piece was split but not depressed. Therefore, two 7-0 screws using the percutaneous technique with washers cannulated, these were 7-0 screws, were placed, confirmed to be in the bone in the AP and lateral planes and then this nicely compressed the fracture site and this was confirmed by the [facility] mini C-arm as well as inspection of the joint after the conclusion of insertion of the screws.  The small puncture wounds over the screws on the lateral side were closed with nylon as were the portals.  A lightly compressive sterile dressing and Marcaine was infiltrated into the knee wounds as well as in the joint.  Taken to recovery room stable condition. Dictated by:   Sharlot Gowda., M.D. Attending Physician:  Teena Dunk DD:  04/14/01 TD:  04/15/01 Job: [id number]

## 2010-06-21 NOTE — H&P (Signed)
NAME:  Nathaniel [name], Nathaniel [name]                           ACCOUNT NO.:  [id number]   MEDICAL RECORD NO.:  [id number]                   PATIENT TYPE:  INP   LOCATION:  IC03                                 FACILITY:  [facility]   PHYSICIAN:  Hanley Hays. Dechurch, M.D.           DATE OF BIRTH:  1970/11/18   DATE OF ADMISSION:  07/04/2003  DATE OF DISCHARGE:                                HISTORY & PHYSICAL   A 40 year old Caucasian male brought to the emergency room obtunded.  He  received Narcan and charcoal.  The family relates that he was confused and  with decreased level of consciousness today.  They also noted that he was  thrashing around, moaning, and coughing up greenish phlegm as well as  drooling.  He was evaluated by the emergency room physician.  The drug  screen was positive for benzodiazepines.  He had an elevated white count of  17,000 and muscle enzymes and urinalysis consistent with rhabdomyolysis.  CT  of the brain revealed no acute findings.  History obtained from the family  and later the patient reveals that he has been abusing multiple drugs,  including OxyContin, Tylox, methadone, and Xanax.  Today, he apparently had  some Klonopin, which may be the etiology of his decreased mental status.  In  any event, he also drinks a 12-pack of beer a day and has been doing so for  the last six months.  The patient does have a history of polysubstance abuse  and was actually a participant in drug rehab and was abstinent for some  time, he stated until two years ago when his mother died, he started using  drugs again.  He states that he uses no IV drugs, illicit drugs.  He denies  any snorting.  In any event, he is currently more alert and able to give an  appropriate history.  He is motivated to proceed with drug rehab, given his  history of alcohol abuse and rhabdomyolysis, he will be admitted to the  hospital for further treatment and evaluated by the ACT  team.   PAST MEDICAL HISTORY:  1.  Status post right shoulder repair.  2. History of drug rehab.   SOCIAL HISTORY:  Drinks a 12-pack of beer a day.  Multiple drug use.  Smokes  one pack per day for many years.  Lives with his father and sister.  He is  divorced.  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   FAMILY HISTORY:  Mother died in her 54s from emphysema.   No drug or alcohol abuse.   REVIEW OF SYSTEMS:  Chronic cough, nonproductive.  No nausea.  No weight  loss or change.  No history of DT's or blackouts.  He admits to being  depressed with relationship issues.  He wants to proceed with drug rehab.  Otherwise negative.   PHYSICAL EXAMINATION:  VITAL SIGNS:  Blood pressure currently is 126/67,  pulse  in the 90s, regular.  O2 saturations are 94% on room air.  GENERAL:  A well-developed and well-nourished male in no distress.  Somewhat  hoarse.  NG tube in place.  HEENT:  Oropharynx dry.  NECK:  Supple.  No JVD.  LUNGS:  Clear bilaterally with some upper airway rhonchi that resolved with  cough.  HEART:  Regular rate and rhythm.  No murmur, rub or gallop.  ABDOMEN:  Soft and nontender.  EXTREMITIES:  Without clubbing or cyanosis.  No edema is present.  NEUROLOGIC:  Intact.  He is alert and oriented.   ASSESSMENT:  1. Unintentional drug overdose.  2. Polysubstance abuse.  3. Rhabdomyolysis.  4. Probable depression.  5. Bronchitis.   The patient was admitted to the hospital for IV fluids and monitoring of his  renal function.  Suspect his leukocytosis is reactive.  The chest x-ray does  not reveal any infiltrates.  Not sure whether he has aspirated or not.  He  does have a coarse, congested cough.  We will use albuterol MDI p.r.n.  and  a short course of antibiotics and monitor.   The plan has been discussed with the patient.  The ACT team will be  consulted.  Proceed with Ativan detox protocol.     ___________________________________________                                         Hanley Hays. Josefine Class, M.D.    FED/MEDQ  D:  07/04/2003  T:  07/04/2003  Job:  [id number]

## 2010-06-21 NOTE — Discharge Summary (Signed)
NAME:  KMARI, HALTER                           ACCOUNT NO.:  [id number]   MEDICAL RECORD NO.:  [id number]                   PATIENT TYPE:  INP   LOCATION:  IC03                                 FACILITY:  [facility]   PHYSICIAN:  Hanley Hays. Dechurch, M.D.           DATE OF BIRTH:  07/25/1970   DATE OF ADMISSION:  07/04/2003  DATE OF DISCHARGE:  07/05/2003                                 DISCHARGE SUMMARY   DIAGNOSES:  1. Unintentional drug overdose.  2. Substance abuse.  3. Tobacco abuse.  4. Probable depression.  5. Rhabdomyolysis.   DISPOSITION:  The patient is discharged to home to follow-up with [facility].   HOSPITAL COURSE:  This is a 40 year old gentleman brought in by EMS  __________.  Apparently he had been using prescription drugs for some time  as well as drinking a 12 pack a day for at least the last six months.  When  he did become alert enough, he relayed the fact that he had taken Klonopin,  perhaps a 1 or 2 mg tablet, as well as Tylox and methadone and had a 12 pack  of beer.   He was brought into the emergency room and received Narcan and had some  improvement.  He had no illicit drugs noted on his drug screen otherwise.  He was treated with IV fluids as his CPK was 5,900 and findings were  consistent with rhabdomyolysis.  Over the course of the next several hours  he became more awake.   On the following day, he was back to his baseline mental status.  He had no  suicidal ideation.  He had been through drug rehabilitation in the past and  felt that mental health would be more appropriate.  This was being arranged  and he is being discharged to home on his own cognizance.  His family is  present at the time of discharge.  He states that this episode and his  potential near loss of life were a reckoning experience and he planned to  seek help.   He was complaining of some left buttock pain where he had received an  injection, probable  Narcan.  He had no induration and a small hematoma.  He  is being discharged to home with reassurance, recommended to take ibuprofen  as needed.  He has no other prescriptions.  His follow-up CK was 5,000.  Renal function remained normal.  He was encouraged to drink extra fluids and  call us should there be any problems or concerns.     ___________________________________________                                         Hanley Hays Josefine Class, M.D.   FED/MEDQ  D:  07/05/2003  T:  07/06/2003  Job:  [id number] 

## 2010-10-29 LAB — URINALYSIS, ROUTINE W REFLEX MICROSCOPIC
Glucose, UA: NEGATIVE
Hgb urine dipstick: NEGATIVE
Protein, ur: NEGATIVE
pH: 5.5

## 2010-10-29 LAB — CBC
HCT: 29.1 — ABNORMAL LOW
HCT: 33.6 — ABNORMAL LOW
Hemoglobin: 11.4 — ABNORMAL LOW
Hemoglobin: 11.6 — ABNORMAL LOW
MCHC: 33.9
MCHC: 34.2
MCV: 96.2
MCV: 96.8
Platelets: 231
RBC: 3.46 — ABNORMAL LOW
RBC: 3.49 — ABNORMAL LOW
WBC: 13.7 — ABNORMAL HIGH
WBC: 15.6 — ABNORMAL HIGH
WBC: 6.6

## 2010-10-29 LAB — URINE CULTURE
Colony Count: NO GROWTH
Culture: NO GROWTH

## 2010-10-29 LAB — COMPREHENSIVE METABOLIC PANEL
ALT: 14
AST: 25
Alkaline Phosphatase: 60
CO2: 22
Calcium: 8.3 — ABNORMAL LOW
Chloride: 109
GFR calc Af Amer: >60
GFR calc non Af Amer: >60
Glucose, Bld: 110 — ABNORMAL HIGH
Potassium: 3.5
Sodium: 138

## 2010-10-29 LAB — BASIC METABOLIC PANEL
CO2: 20
Chloride: 109
GFR calc Af Amer: >60
Potassium: 3.5

## 2010-10-29 LAB — CULTURE, BLOOD (ROUTINE X 2): Culture: NO GROWTH

## 2010-10-29 LAB — MAGNESIUM: Magnesium: 2

## 2010-10-29 LAB — RAPID URINE DRUG SCREEN, HOSP PERFORMED
Cocaine: NOT DETECTED
Tetrahydrocannabinol: NOT DETECTED

## 2010-10-29 LAB — DIFFERENTIAL
Basophils Relative: 0
Eosinophils Absolute: 0.2
Eosinophils Relative: 3
Lymphs Abs: 1.5
Monocytes Relative: 6

## 2010-10-29 LAB — EXPECTORATED SPUTUM ASSESSMENT W GRAM STAIN, RFLX TO RESP C

## 2010-11-08 LAB — DIFFERENTIAL
Basophils Absolute: 0 10*3/uL (ref 0.0–0.1)
Lymphocytes Relative: 3 % — ABNORMAL LOW (ref 12–46)
Lymphs Abs: 0.5 10*3/uL — ABNORMAL LOW (ref 0.7–4.0)
Lymphs Abs: 0.4 10*3/uL — ABNORMAL LOW (ref 0.7–4.0)
Monocytes Absolute: 0.3 10*3/uL (ref 0.1–1.0)
Monocytes Relative: 2 % — ABNORMAL LOW (ref 3–12)
Monocytes Relative: 2 % — ABNORMAL LOW (ref 3–12)
Neutro Abs: 14.6 10*3/uL — ABNORMAL HIGH (ref 1.7–7.7)
Neutro Abs: 7.2 10*3/uL (ref 1.7–7.7)
Neutrophils Relative %: 92 % — ABNORMAL HIGH (ref 43–77)

## 2010-11-08 LAB — CBC
HCT: 39.5 % (ref 39.0–52.0)
Hemoglobin: 12.8 g/dL — ABNORMAL LOW (ref 13.0–17.0)
Hemoglobin: 13.1 g/dL (ref 13.0–17.0)
Platelets: 279 10*3/uL (ref 150–400)
RBC: 4 MIL/uL — ABNORMAL LOW (ref 4.22–5.81)
RBC: 4.35 MIL/uL (ref 4.22–5.81)
RDW: 14.9 % (ref 11.5–15.5)
WBC: 12.7 10*3/uL — ABNORMAL HIGH (ref 4.0–10.5)
WBC: 15.5 10*3/uL — ABNORMAL HIGH (ref 4.0–10.5)
WBC: 7.8 10*3/uL (ref 4.0–10.5)

## 2010-11-08 LAB — GLUCOSE, CAPILLARY
Glucose-Capillary: 112 mg/dL — ABNORMAL HIGH (ref 70–99)
Glucose-Capillary: 138 mg/dL — ABNORMAL HIGH (ref 70–99)
Glucose-Capillary: 184 mg/dL — ABNORMAL HIGH (ref 70–99)

## 2010-11-08 LAB — BASIC METABOLIC PANEL
BUN: 12 mg/dL (ref 6–23)
Calcium: 8.4 mg/dL (ref 8.4–10.5)
Calcium: 8.4 mg/dL (ref 8.4–10.5)
Calcium: 9.3 mg/dL (ref 8.4–10.5)
Creatinine, Ser: 0.9 mg/dL (ref 0.4–1.5)
GFR calc Af Amer: >60 mL/min (ref >60)
GFR calc Af Amer: >60 mL/min (ref >60)
GFR calc Af Amer: >60 mL/min (ref >60)
GFR calc non Af Amer: >60 mL/min (ref >60)
GFR calc non Af Amer: >60 mL/min (ref >60)
GFR calc non Af Amer: >60 mL/min (ref >60)
Glucose, Bld: 215 mg/dL — ABNORMAL HIGH (ref 70–99)
Potassium: 3.9 mEq/L (ref 3.5–5.1)
Sodium: 134 mEq/L — ABNORMAL LOW (ref 135–145)
Sodium: 135 mEq/L (ref 135–145)

## 2010-11-08 LAB — RAPID URINE DRUG SCREEN, HOSP PERFORMED
Amphetamines: NOT DETECTED
Tetrahydrocannabinol: NOT DETECTED

## 2010-11-08 LAB — ETHANOL: Alcohol, Ethyl (B): 21 mg/dL — ABNORMAL HIGH (ref 0–10)

## 2010-11-11 LAB — CBC
HCT: 35.8 — ABNORMAL LOW
Hemoglobin: 12.3 — ABNORMAL LOW
MCHC: 34.4
MCV: 98.5
Platelets: 250
RBC: 3.63 — ABNORMAL LOW
RDW: 13.6
WBC: 10.4

## 2010-11-11 LAB — DIFFERENTIAL
Basophils Absolute: 0
Basophils Relative: 0
Eosinophils Absolute: 0.2
Eosinophils Relative: 2
Lymphocytes Relative: 16
Lymphs Abs: 1.6
Monocytes Absolute: 0.7
Monocytes Relative: 6
Neutro Abs: 7.9 — ABNORMAL HIGH
Neutrophils Relative %: 76

## 2010-11-11 LAB — COMPREHENSIVE METABOLIC PANEL
ALT: 18
AST: 21
CO2: 28
Calcium: 8.6
Chloride: 101
Creatinine, Ser: 0.79
GFR calc Af Amer: >60
GFR calc non Af Amer: >60
Glucose, Bld: 127 — ABNORMAL HIGH
Sodium: 138
Total Bilirubin: 0.5

## 2010-11-11 LAB — URINALYSIS, ROUTINE W REFLEX MICROSCOPIC
Bilirubin Urine: NEGATIVE
Glucose, UA: NEGATIVE
Hgb urine dipstick: NEGATIVE
Ketones, ur: NEGATIVE
Nitrite: NEGATIVE
Protein, ur: NEGATIVE
Specific Gravity, Urine: 1.004 — ABNORMAL LOW
Urobilinogen, UA: 0.2
pH: 6

## 2010-11-11 LAB — APTT: aPTT: 33

## 2010-11-11 LAB — COMPREHENSIVE METABOLIC PANEL WITH GFR
Albumin: 3.3 — ABNORMAL LOW
Alkaline Phosphatase: 64
BUN: 9
Potassium: 3.9
Total Protein: 5.7 — ABNORMAL LOW

## 2010-11-11 LAB — PROTIME-INR
INR: 0.9
Prothrombin Time: 11.8

## 2010-11-11 LAB — OCCULT BLOOD X 1 CARD TO LAB, STOOL: Fecal Occult Bld: NEGATIVE

## 2010-11-11 LAB — LIPASE, BLOOD: Lipase: 15

## 2010-11-11 LAB — SAMPLE TO BLOOD BANK

## 2010-11-15 LAB — CBC
HCT: 38.1 — ABNORMAL LOW
MCV: 94.7
Platelets: 332
RBC: 4.02 — ABNORMAL LOW
WBC: 10.3

## 2010-11-15 LAB — URINALYSIS, ROUTINE W REFLEX MICROSCOPIC
Glucose, UA: NEGATIVE
Hgb urine dipstick: NEGATIVE
Ketones, ur: NEGATIVE
Protein, ur: NEGATIVE
Urobilinogen, UA: 0.2

## 2010-11-15 LAB — BASIC METABOLIC PANEL
BUN: 5 — ABNORMAL LOW
Chloride: 107
GFR calc Af Amer: >60
GFR calc non Af Amer: >60
Potassium: 4.1
Sodium: 142

## 2010-11-15 LAB — RAPID URINE DRUG SCREEN, HOSP PERFORMED
Amphetamines: NOT DETECTED
Barbiturates: NOT DETECTED
Benzodiazepines: NOT DETECTED
Opiates: NOT DETECTED
Tetrahydrocannabinol: NOT DETECTED

## 2010-11-15 LAB — DIFFERENTIAL
Eosinophils Relative: 1
Lymphocytes Relative: 33
Lymphs Abs: 3.4 — ABNORMAL HIGH
Monocytes Relative: 4

## 2010-11-15 LAB — ETHANOL: Alcohol, Ethyl (B): 39 — ABNORMAL HIGH

## 2010-11-20 LAB — CBC
HCT: 34.7 — ABNORMAL LOW
Hemoglobin: 11.6 — ABNORMAL LOW
MCHC: 33.8
MCHC: 33.9
MCHC: 33.9
MCV: 90.1
MCV: 93.1
Platelets: 264
Platelets: 284
Platelets: 610 — ABNORMAL HIGH
RBC: 3.67 — ABNORMAL LOW
RDW: 14.2 — ABNORMAL HIGH
RDW: 16.3 — ABNORMAL HIGH
RDW: 16.3 — ABNORMAL HIGH
WBC: 13.2 — ABNORMAL HIGH
WBC: 7.5

## 2010-11-20 LAB — BASIC METABOLIC PANEL
BUN: 16
BUN: 3 — ABNORMAL LOW
CO2: 23
CO2: 28
Chloride: 108
Creatinine, Ser: 0.8
GFR calc non Af Amer: >60
Glucose, Bld: 110 — ABNORMAL HIGH
Potassium: 3.9

## 2010-11-20 LAB — BLOOD GAS, ARTERIAL
Acid-Base Excess: 0.7
Acid-base deficit: 0.2
Bicarbonate: 22.8
O2 Content: 2
O2 Saturation: 93.6
O2 Saturation: 96
Patient temperature: 98.6
TCO2: 20.4
pCO2 arterial: 49.4 — ABNORMAL HIGH
pH, Arterial: 7.453 — ABNORMAL HIGH

## 2010-11-20 LAB — COMPREHENSIVE METABOLIC PANEL
ALT: 21
AST: 22
Albumin: 2.7 — ABNORMAL LOW
Alkaline Phosphatase: 104
CO2: 27
Calcium: 8.2 — ABNORMAL LOW
Chloride: 99
Creatinine, Ser: 0.69
GFR calc Af Amer: >60
GFR calc non Af Amer: >60
Glucose, Bld: 86
Potassium: 3.9
Sodium: 136
Total Protein: 5.3 — ABNORMAL LOW
Total Protein: 6.2

## 2010-11-20 LAB — DIFFERENTIAL
Basophils Absolute: 0.3 — ABNORMAL HIGH
Basophils Relative: 0
Basophils Relative: 2 — ABNORMAL HIGH
Eosinophils Absolute: 0.2
Eosinophils Absolute: 0.2
Eosinophils Relative: 3
Lymphocytes Relative: 22
Lymphs Abs: 1.7
Monocytes Absolute: 0.4
Monocytes Relative: 4
Neutrophils Relative %: 62
Neutrophils Relative %: 85 — ABNORMAL HIGH

## 2010-11-20 LAB — CARDIAC PANEL(CRET KIN+CKTOT+MB+TROPI)
CK, MB: 1.6
Relative Index: INVALID
Troponin I: 0.03

## 2010-11-20 LAB — CULTURE, BLOOD (ROUTINE X 2)

## 2010-11-20 LAB — URINALYSIS, ROUTINE W REFLEX MICROSCOPIC
Bilirubin Urine: NEGATIVE
Glucose, UA: NEGATIVE
Ketones, ur: NEGATIVE
pH: 5.5

## 2010-11-20 LAB — URIC ACID: Uric Acid, Serum: 2.5

## 2010-11-20 LAB — RAPID URINE DRUG SCREEN, HOSP PERFORMED
Amphetamines: NOT DETECTED
Tetrahydrocannabinol: NOT DETECTED

## 2010-11-20 LAB — B-NATRIURETIC PEPTIDE (CONVERTED LAB): Pro B Natriuretic peptide (BNP): <30

## 2010-11-20 LAB — HIV ANTIBODY (ROUTINE TESTING W REFLEX): HIV: NONREACTIVE

## 2010-11-20 LAB — D-DIMER, QUANTITATIVE: D-Dimer, Quant: 0.85 — ABNORMAL HIGH

## 2017-04-11 ENCOUNTER — Telehealth (HOSPITAL_BASED_OUTPATIENT_CLINIC_OR_DEPARTMENT_OTHER): Payer: Self-pay | Admitting: Emergency Medicine

## 2018-05-07 ENCOUNTER — Other Ambulatory Visit: Payer: Self-pay

## 2018-05-07 ENCOUNTER — Emergency Department (HOSPITAL_COMMUNITY): Payer: Self-pay

## 2018-05-07 ENCOUNTER — Encounter (HOSPITAL_COMMUNITY): Payer: Self-pay

## 2018-05-07 ENCOUNTER — Emergency Department (HOSPITAL_COMMUNITY)
Admission: EM | Admit: 2018-05-07 | Discharge: 2018-05-07 | Disposition: A | Discharge status: Home / Self Care | Payer: Self-pay | Attending: Emergency Medicine | Admitting: Emergency Medicine

## 2018-05-07 ENCOUNTER — Emergency Department (HOSPITAL_BASED_OUTPATIENT_CLINIC_OR_DEPARTMENT_OTHER): Payer: Self-pay

## 2018-05-07 ENCOUNTER — Inpatient Hospital Stay (HOSPITAL_COMMUNITY): Admit: 2018-05-07 | Payer: Self-pay

## 2018-05-07 DIAGNOSIS — R0602 Shortness of breath: Secondary | ICD-10-CM | POA: Insufficient documentation

## 2018-05-07 DIAGNOSIS — M7989 Other specified soft tissue disorders: Secondary | ICD-10-CM

## 2018-05-07 DIAGNOSIS — M79604 Pain in right leg: Secondary | ICD-10-CM | POA: Insufficient documentation

## 2018-05-07 DIAGNOSIS — R52 Pain, unspecified: Secondary | ICD-10-CM

## 2018-05-07 DIAGNOSIS — R2241 Localized swelling, mass and lump, right lower limb: Secondary | ICD-10-CM | POA: Insufficient documentation

## 2018-05-07 DIAGNOSIS — R079 Chest pain, unspecified: Secondary | ICD-10-CM | POA: Insufficient documentation

## 2018-05-07 HISTORY — DX: Other injury of unspecified body region, initial encounter: T14.8XXA

## 2018-05-07 LAB — BASIC METABOLIC PANEL
Anion gap: 7 (ref 5–15)
BUN: 22 mg/dL — ABNORMAL HIGH (ref 6–20)
CO2: 30 mmol/L (ref 22–32)
Calcium: 8.5 mg/dL — ABNORMAL LOW (ref 8.9–10.3)
Chloride: 105 mmol/L (ref 98–111)
Creatinine, Ser: 0.84 mg/dL (ref 0.61–1.24)
GFR calc Af Amer: >60 mL/min (ref >60)
GFR calc non Af Amer: >60 mL/min (ref >60)
Glucose, Bld: 119 mg/dL — ABNORMAL HIGH (ref 70–99)
Potassium: 4 mmol/L (ref 3.5–5.1)
Sodium: 142 mmol/L (ref 135–145)

## 2018-05-07 LAB — CBC WITH DIFFERENTIAL/PLATELET
Abs Immature Granulocytes: 0.02 10*3/uL (ref 0.00–0.07)
Basophils Absolute: 0.1 10*3/uL (ref 0.0–0.1)
Basophils Relative: 1 %
Eosinophils Absolute: 0.3 10*3/uL (ref 0.0–0.5)
Eosinophils Relative: 6 %
HCT: 40.5 % (ref 39.0–52.0)
Hemoglobin: 12.5 g/dL — ABNORMAL LOW (ref 13.0–17.0)
Immature Granulocytes: 0 %
Lymphocytes Relative: 31 %
Lymphs Abs: 1.4 10*3/uL (ref 0.7–4.0)
MCH: 30.4 pg (ref 26.0–34.0)
MCHC: 30.9 g/dL (ref 30.0–36.0)
MCV: 98.5 fL (ref 80.0–100.0)
Monocytes Absolute: 0.3 10*3/uL (ref 0.1–1.0)
Monocytes Relative: 7 %
Neutro Abs: 2.5 10*3/uL (ref 1.7–7.7)
Neutrophils Relative %: 55 %
Platelets: 184 10*3/uL (ref 150–400)
RBC: 4.11 MIL/uL — ABNORMAL LOW (ref 4.22–5.81)
RDW: 14.3 % (ref 11.5–15.5)
WBC: 4.5 10*3/uL (ref 4.0–10.5)
nRBC: 0 % (ref 0.0–0.2)

## 2018-05-07 LAB — D-DIMER, QUANTITATIVE (NOT AT ARMC): D-Dimer, Quant: 0.57 ug/mL-FEU — ABNORMAL HIGH (ref 0.00–0.50)

## 2018-05-07 LAB — BRAIN NATRIURETIC PEPTIDE: B Natriuretic Peptide: 36.9 pg/mL (ref 0.0–100.0)

## 2018-05-07 LAB — TROPONIN I: Troponin I: <0.03 ng/mL (ref <0.03)

## 2018-05-07 MED ORDER — SODIUM CHLORIDE (PF) 0.9 % IJ SOLN
INTRAMUSCULAR | Status: AC
  Filled 2018-05-07: qty 50

## 2018-05-07 MED ORDER — IOHEXOL 350 MG/ML SOLN
100.0000 mL | Freq: Once | INTRAVENOUS | Status: AC | PRN
  Administered 2018-05-07: 100 mL via INTRAVENOUS

## 2018-05-07 MED ORDER — ACETAMINOPHEN 500 MG PO TABS
1000.0000 mg | ORAL_TABLET | Freq: Once | ORAL | Status: AC
  Administered 2018-05-07: 1000 mg via ORAL
  Filled 2018-05-07: qty 2

## 2018-05-07 MED ORDER — CEPHALEXIN 500 MG PO CAPS: 500.0000 mg | ORAL_CAPSULE | Freq: Two times a day (BID) | ORAL | 0 refills | Status: AC
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
## 2018-05-07 NOTE — Progress Notes (Signed)
Bilateral lower extremity venous duplex completed. Refer to "CV Proc" under chart review to view preliminary results.  05/07/2018 10:09 AM Gertie Fey, MHA, RVT, RDCS, RDMS

## 2018-05-07 NOTE — ED Triage Notes (Signed)
Pt states he fell several months ago on his right leg and was seen at that time. Pt states that since then, his leg has been swollen and red. Pt states that he had to stay at the [facility] for 6 days, due to cellulitis.

## 2018-05-07 NOTE — ED Notes (Signed)
Pt placed on 2L [place] for sats in lower 90s/upper 80s.

## 2018-05-07 NOTE — Discharge Instructions (Addendum)
Follow up with wellness center to establish care. Return if symptoms worsen.

## 2018-10-07 ENCOUNTER — Encounter (HOSPITAL_COMMUNITY): Payer: Self-pay

## 2018-10-07 ENCOUNTER — Other Ambulatory Visit: Payer: Self-pay

## 2018-10-07 ENCOUNTER — Emergency Department (HOSPITAL_COMMUNITY)
Admission: EM | Admit: 2018-10-07 | Discharge: 2018-10-07 | Disposition: A | Discharge status: Home / Self Care | Payer: PRIVATE HEALTH INSURANCE | Attending: Emergency Medicine | Admitting: Emergency Medicine

## 2018-10-07 DIAGNOSIS — Z79899 Other long term (current) drug therapy: Secondary | ICD-10-CM | POA: Insufficient documentation

## 2018-10-07 DIAGNOSIS — F40298 Other specified phobia: Secondary | ICD-10-CM | POA: Insufficient documentation

## 2018-10-07 DIAGNOSIS — R112 Nausea with vomiting, unspecified: Secondary | ICD-10-CM | POA: Diagnosis not present

## 2018-10-07 DIAGNOSIS — G43009 Migraine without aura, not intractable, without status migrainosus: Secondary | ICD-10-CM | POA: Diagnosis not present

## 2018-10-07 DIAGNOSIS — R51 Headache: Secondary | ICD-10-CM | POA: Diagnosis present

## 2018-10-07 DIAGNOSIS — H53149 Visual discomfort, unspecified: Secondary | ICD-10-CM | POA: Insufficient documentation

## 2018-10-07 DIAGNOSIS — H538 Other visual disturbances: Secondary | ICD-10-CM | POA: Diagnosis not present

## 2018-10-07 HISTORY — DX: Migraine, unspecified, not intractable, without status migrainosus: G43.909

## 2018-10-07 MED ORDER — METOCLOPRAMIDE HCL 5 MG/ML IJ SOLN
5.0000 mg | Freq: Once | INTRAMUSCULAR | Status: AC
  Administered 2018-10-07: 5 mg via INTRAVENOUS
  Filled 2018-10-07: qty 2

## 2018-10-07 MED ORDER — SODIUM CHLORIDE 0.9 % IV BOLUS
1000.0000 mL | Freq: Once | INTRAVENOUS | Status: AC
  Administered 2018-10-07: 12:00:00 1000 mL via INTRAVENOUS

## 2018-10-07 MED ORDER — SUMATRIPTAN 10 MG/ACT NA SOLN: 10.0000 mg | Freq: Once | NASAL | 0 refills | Status: DC

## 2018-10-07 MED ORDER — KETOROLAC TROMETHAMINE 30 MG/ML IJ SOLN
30.0000 mg | Freq: Once | INTRAMUSCULAR | Status: AC
  Administered 2018-10-07: 12:00:00 30 mg via INTRAVENOUS
  Filled 2018-10-07: qty 1

## 2018-10-07 MED ORDER — DEXAMETHASONE SODIUM PHOSPHATE 10 MG/ML IJ SOLN
10.0000 mg | Freq: Once | INTRAMUSCULAR | Status: AC
  Administered 2018-10-07: 12:00:00 10 mg via INTRAVENOUS
  Filled 2018-10-07: qty 1

## 2018-10-07 MED ORDER — DIPHENHYDRAMINE HCL 25 MG PO CAPS
25.0000 mg | ORAL_CAPSULE | Freq: Once | ORAL | Status: AC
  Administered 2018-10-07: 12:00:00 25 mg via ORAL
  Filled 2018-10-07: qty 1

## 2018-10-07 NOTE — ED Notes (Signed)
An After Visit Summary was printed and given to the patient. Discharge instructions given and no further questions at this time.  

## 2018-10-07 NOTE — ED Triage Notes (Signed)
Patient c/o migraine x 4 days. Patient also c/o N/V, blurred vision, and sensitivity to light.
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
## 2018-10-07 NOTE — Discharge Instructions (Signed)
Establish care with a primary care provider for follow up and further management. Return to ER for new or worsening symptoms. Try Sumatriptan spray at onset of your next migraine, spray in the Unaffected nostril once.

## 2018-11-30 ENCOUNTER — Other Ambulatory Visit: Payer: Self-pay

## 2018-11-30 ENCOUNTER — Emergency Department (HOSPITAL_COMMUNITY): Payer: Self-pay

## 2018-11-30 ENCOUNTER — Emergency Department (HOSPITAL_BASED_OUTPATIENT_CLINIC_OR_DEPARTMENT_OTHER)
Admit: 2018-11-30 | Discharge: 2018-11-30 | Disposition: A | Discharge status: Home / Self Care | Payer: Self-pay | Attending: Emergency Medicine | Admitting: Emergency Medicine

## 2018-11-30 ENCOUNTER — Encounter (HOSPITAL_COMMUNITY): Payer: Self-pay | Admitting: Emergency Medicine

## 2018-11-30 ENCOUNTER — Emergency Department (HOSPITAL_COMMUNITY)
Admission: EM | Admit: 2018-11-30 | Discharge: 2018-11-30 | Disposition: A | Discharge status: Home / Self Care | Payer: Self-pay | Attending: Emergency Medicine | Admitting: Emergency Medicine

## 2018-11-30 DIAGNOSIS — J449 Chronic obstructive pulmonary disease, unspecified: Secondary | ICD-10-CM | POA: Insufficient documentation

## 2018-11-30 DIAGNOSIS — R609 Edema, unspecified: Secondary | ICD-10-CM

## 2018-11-30 DIAGNOSIS — Z79899 Other long term (current) drug therapy: Secondary | ICD-10-CM | POA: Insufficient documentation

## 2018-11-30 DIAGNOSIS — Z87891 Personal history of nicotine dependence: Secondary | ICD-10-CM | POA: Insufficient documentation

## 2018-11-30 DIAGNOSIS — R52 Pain, unspecified: Secondary | ICD-10-CM

## 2018-11-30 HISTORY — DX: Chronic obstructive pulmonary disease, unspecified: J44.9

## 2018-11-30 LAB — URINALYSIS, ROUTINE W REFLEX MICROSCOPIC
Bacteria, UA: NONE SEEN
Bilirubin Urine: NEGATIVE
Glucose, UA: NEGATIVE mg/dL
Ketones, ur: NEGATIVE mg/dL
Leukocytes,Ua: NEGATIVE
Nitrite: NEGATIVE
Protein, ur: NEGATIVE mg/dL
Specific Gravity, Urine: 1.014 (ref 1.005–1.030)
pH: 5 (ref 5.0–8.0)

## 2018-11-30 LAB — BASIC METABOLIC PANEL
Anion gap: 8 (ref 5–15)
BUN: 18 mg/dL (ref 6–20)
CO2: 31 mmol/L (ref 22–32)
Calcium: 8.6 mg/dL — ABNORMAL LOW (ref 8.9–10.3)
Chloride: 100 mmol/L (ref 98–111)
Creatinine, Ser: 0.63 mg/dL (ref 0.61–1.24)
GFR calc Af Amer: >60 mL/min (ref >60)
GFR calc non Af Amer: >60 mL/min (ref >60)
Glucose, Bld: 111 mg/dL — ABNORMAL HIGH (ref 70–99)
Potassium: 4.1 mmol/L (ref 3.5–5.1)
Sodium: 139 mmol/L (ref 135–145)

## 2018-11-30 LAB — CBC WITH DIFFERENTIAL/PLATELET
Abs Immature Granulocytes: 0.01 10*3/uL (ref 0.00–0.07)
Basophils Absolute: 0 10*3/uL (ref 0.0–0.1)
Basophils Relative: 1 %
Eosinophils Absolute: 0.2 10*3/uL (ref 0.0–0.5)
Eosinophils Relative: 4 %
HCT: 40 % (ref 39.0–52.0)
Hemoglobin: 12.2 g/dL — ABNORMAL LOW (ref 13.0–17.0)
Immature Granulocytes: 0 %
Lymphocytes Relative: 19 %
Lymphs Abs: 1 10*3/uL (ref 0.7–4.0)
MCH: 30 pg (ref 26.0–34.0)
MCHC: 30.5 g/dL (ref 30.0–36.0)
MCV: 98.3 fL (ref 80.0–100.0)
Monocytes Absolute: 0.4 10*3/uL (ref 0.1–1.0)
Monocytes Relative: 7 %
Neutro Abs: 3.7 10*3/uL (ref 1.7–7.7)
Neutrophils Relative %: 69 %
Platelets: 181 10*3/uL (ref 150–400)
RBC: 4.07 MIL/uL — ABNORMAL LOW (ref 4.22–5.81)
RDW: 13.8 % (ref 11.5–15.5)
WBC: 5.4 10*3/uL (ref 4.0–10.5)
nRBC: 0 % (ref 0.0–0.2)

## 2018-11-30 LAB — BRAIN NATRIURETIC PEPTIDE: B Natriuretic Peptide: 32.8 pg/mL (ref 0.0–100.0)

## 2018-11-30 LAB — ALBUMIN: Albumin: 3.8 g/dL (ref 3.5–5.0)

## 2018-11-30 MED ORDER — POTASSIUM CHLORIDE ER 10 MEQ PO TBCR: 10.0000 meq | EXTENDED_RELEASE_TABLET | Freq: Every day | ORAL | 0 refills | Status: DC

## 2018-11-30 MED ORDER — FUROSEMIDE 40 MG PO TABS
40.0000 mg | ORAL_TABLET | Freq: Once | ORAL | Status: AC
  Administered 2018-11-30: 13:00:00 40 mg via ORAL
  Filled 2018-11-30: qty 1

## 2018-11-30 MED ORDER — FUROSEMIDE 20 MG PO TABS: 20.0000 mg | ORAL_TABLET | Freq: Every day | ORAL | 0 refills | Status: DC

## 2018-11-30 MED ORDER — FUROSEMIDE 10 MG/ML IJ SOLN: 40.0000 mg | Freq: Once | INTRAMUSCULAR | Status: DC

## 2018-11-30 NOTE — Progress Notes (Signed)
Bilateral lower extremity venous duplex has been completed. Preliminary results can be found in CV Proc through chart review.  Results were given to Dr. Roslynn Amble.  11/30/18 10:43 AM Nathaniel [name] RVT

## 2018-11-30 NOTE — ED Triage Notes (Signed)
Pt reports having bilat lower leg swelling and pains, left is worse than right for months and having redness to left leg x 2 weeks. Hx cellulitis before.
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
## 2018-11-30 NOTE — Progress Notes (Signed)
TOC CM spoke to pt and currently works but does not have insurance or PCP. Contacted [facility] and scheduled appt for 12/22/2018 at 0930 am. Pt can pick up lasix at [facility] for $4. Able to afford. Will need a note for work. Doctor Phillips, [place] ED TOC CM [phone]

## 2018-11-30 NOTE — Discharge Instructions (Addendum)
Swelling could be due to methadone. Discuss this with your clinic. Follow up with primary care provider- scheduled with [facility] on 11/18 at 9:30AM. Take Lasix as prescribed. Take Kdur daily as prescribed.  Return to ER for new or worsening symptoms.

## 2018-11-30 NOTE — ED Notes (Signed)
Urine culture sent with UA sample to the lab

## 2018-12-22 ENCOUNTER — Inpatient Hospital Stay (INDEPENDENT_AMBULATORY_CARE_PROVIDER_SITE_OTHER): Payer: PRIVATE HEALTH INSURANCE | Admitting: Primary Care

## 2019-01-19 ENCOUNTER — Telehealth (HOSPITAL_COMMUNITY): Payer: Self-pay | Admitting: *Deleted

## 2019-01-19 ENCOUNTER — Other Ambulatory Visit: Payer: Self-pay

## 2019-01-19 DIAGNOSIS — T79A0XD Compartment syndrome, unspecified, subsequent encounter: Secondary | ICD-10-CM

## 2019-01-19 NOTE — Telephone Encounter (Signed)

## 2019-01-20 ENCOUNTER — Ambulatory Visit (HOSPITAL_COMMUNITY)
Admission: RE | Admit: 2019-01-20 | Discharge: 2019-01-20 | Disposition: A | Discharge status: Home / Self Care | Payer: 59 | Source: Ambulatory Visit | Attending: Vascular Surgery | Admitting: Vascular Surgery

## 2019-01-20 ENCOUNTER — Ambulatory Visit (INDEPENDENT_AMBULATORY_CARE_PROVIDER_SITE_OTHER): Payer: 59 | Admitting: Vascular Surgery

## 2019-01-20 ENCOUNTER — Other Ambulatory Visit: Payer: Self-pay | Admitting: Vascular Surgery

## 2019-01-20 ENCOUNTER — Other Ambulatory Visit: Payer: Self-pay

## 2019-01-20 ENCOUNTER — Encounter: Payer: Self-pay | Admitting: Vascular Surgery

## 2019-01-20 VITALS — BP 157/79 | HR 78 | Temp 97.9°F | Resp 18 | Ht 72.0 in | Wt 270.0 lb

## 2019-01-20 DIAGNOSIS — R1907 Generalized intra-abdominal and pelvic swelling, mass and lump: Secondary | ICD-10-CM

## 2019-01-20 DIAGNOSIS — I890 Lymphedema, not elsewhere classified: Secondary | ICD-10-CM

## 2019-01-20 DIAGNOSIS — T79A0XD Compartment syndrome, unspecified, subsequent encounter: Secondary | ICD-10-CM | POA: Diagnosis not present

## 2019-01-20 DIAGNOSIS — R609 Edema, unspecified: Secondary | ICD-10-CM | POA: Diagnosis not present

## 2019-01-20 DIAGNOSIS — R69 Illness, unspecified: Secondary | ICD-10-CM | POA: Diagnosis not present
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
## 2019-02-02 ENCOUNTER — Other Ambulatory Visit: Payer: Self-pay | Admitting: *Deleted

## 2019-02-02 DIAGNOSIS — Z01812 Encounter for preprocedural laboratory examination: Secondary | ICD-10-CM

## 2019-02-03 DIAGNOSIS — Z01812 Encounter for preprocedural laboratory examination: Secondary | ICD-10-CM | POA: Diagnosis not present

## 2019-02-07 ENCOUNTER — Ambulatory Visit
Admission: RE | Admit: 2019-02-07 | Discharge: 2019-02-07 | Disposition: A | Discharge status: Home / Self Care | Payer: 59 | Source: Ambulatory Visit | Attending: Vascular Surgery | Admitting: Vascular Surgery

## 2019-02-07 ENCOUNTER — Other Ambulatory Visit: Payer: Self-pay | Admitting: *Deleted

## 2019-02-07 DIAGNOSIS — M7989 Other specified soft tissue disorders: Secondary | ICD-10-CM | POA: Diagnosis not present

## 2019-02-07 DIAGNOSIS — R600 Localized edema: Secondary | ICD-10-CM | POA: Diagnosis not present

## 2019-02-07 DIAGNOSIS — R1907 Generalized intra-abdominal and pelvic swelling, mass and lump: Secondary | ICD-10-CM

## 2019-02-07 MED ORDER — IOPAMIDOL (ISOVUE-370) INJECTION 76%
75.0000 mL | Freq: Once | INTRAVENOUS | Status: AC | PRN
  Administered 2019-02-07: 75 mL via INTRAVENOUS

## 2019-02-09 ENCOUNTER — Other Ambulatory Visit: Payer: Self-pay

## 2019-02-09 ENCOUNTER — Ambulatory Visit: Payer: 59 | Admitting: Vascular Surgery

## 2019-02-09 ENCOUNTER — Ambulatory Visit (INDEPENDENT_AMBULATORY_CARE_PROVIDER_SITE_OTHER): Payer: 59 | Admitting: Vascular Surgery

## 2019-02-09 ENCOUNTER — Encounter: Payer: Self-pay | Admitting: Vascular Surgery

## 2019-02-09 DIAGNOSIS — M7989 Other specified soft tissue disorders: Secondary | ICD-10-CM

## 2019-02-09 DIAGNOSIS — I890 Lymphedema, not elsewhere classified: Secondary | ICD-10-CM

## 2019-02-09 NOTE — Progress Notes (Signed)
Patient name: Nathaniel [name] DOB: 1970-11-02 Sex: male    Referring Provider is No ref. provider found  PCP is Patient, No Pcp Per  REASON FOR VIRTUAL VISIT: To discuss results of CT abdomen pelvis  I connected with Nathaniel [name] on 02/09/19 at 10:00 AM EST by a video enabled telemedicine application and verified that I am speaking with the correct person using two identifiers. I discussed the limitations of evaluation and management by telemedicine and the availability of in person appointments. The patient expressed understanding and agreed to proceed.  Location: Patient: Home Provider: Office  HPI: Nathaniel [name] is a 49 y.o. male who I saw on 01/20/2019 with bilateral lower extremity swelling. This was more significant on the left side. His venous duplex scan on 01/20/2019 showed no evidence of DVT and no evidence of significant deep or superficial venous reflux. His waveforms in the common femoral vein suggested possibly obstruction more centrally and therefore I recommended a CT scan to rule out an obstructive mass that might be compressing his venous or lymphatic system. Since I saw him last he has been elevating his legs and this does help. He is also obtain compression stockings and wears these at work. 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 His legs swell during the day. The swelling has improved slightly.  Current Outpatient Medications  Medication Sig Dispense Refill  . Aspirin-Acetaminophen-Caffeine (GOODY HEADACHE PO) Take 1 packet by mouth as needed (headache/pain).    Marland Kitchen gabapentin (NEURONTIN) 100 MG capsule Take 1 cap (100 mg) by mouth daily on 01/21/19 then one cap twice daily on 01/22/19 then one cap three times per day starting 01/23/19    . lisinopril (ZESTRIL) 20 MG tablet Take 20 mg by mouth daily.    Marland Kitchen METHADONE HCL PO Take 200 mg by mouth daily. Liquid Form    . oxyCODONE-acetaminophen (PERCOCET) 10-325 MG tablet Take 1 tablet by mouth 4 (four)  times daily as needed.    . furosemide (LASIX) 20 MG tablet Take 1 tablet (20 mg total) by mouth daily for 7 days. 7 tablet 0  . potassium chloride (KLOR-CON) 10 MEQ tablet Take 1 tablet (10 mEq total) by mouth daily for 7 days. 7 tablet 0  . SUMAtriptan 10 MG/ACT SOLN Place 10 mg into the nose once for 1 dose. Use at onset of headache 1 each 0   No current facility-administered medications for this visit.   REVIEW OF SYSTEMS: Arly.Keller ] denotes positive finding; [  ] denotes negative finding  CARDIOVASCULAR:  [ ]  chest pain   [ ]  dyspnea on exertion  [ ]  leg swelling  CONSTITUTIONAL:  [ ]  fever   [ ]  chills   OBSERVATIONS/OBJECTIVE: The patient was not short of breath on exam. He was alert and oriented.   DATA:  CT ABDOMEN PELVIS: I have reviewed the images of the CT abdomen and pelvis. There is no evidence of an obstruction in the iliac system on the left. There is no mass compressing his lymphatics or venous system. He has enlarged lymph nodes that are more pronounced on the left.  MEDICAL ISSUES:  BILATERAL LOWER EXTREMITY SWELLING: I went over the patient's CT results with him today. There is no obstruction of the iliac system or lymphatic system by CT. We have again discussed the importance of leg elevation and the proper positioning for this. I encouraged him to try to elevate his legs for 30 minutes after work.  He should also wear his compression stockings during work. I will see him back as needed.  FOLLOW UP INSTRUCTIONS:   I discussed the assessment and treatment plan with the patient. The patient was provided an opportunity to ask questions and all were answered. The patient agreed with the plan and demonstrated an understanding of the instructions. The patient was advised to call back or seek an in-person evaluation if the symptoms worsen or if the condition fails to improve as anticipated.  I provided 10 minutes of non-face-to-face time during this encounter.  Deitra Mayo [facility]

## 2019-02-10 ENCOUNTER — Ambulatory Visit: Payer: 59 | Admitting: Vascular Surgery

## 2019-02-18 DIAGNOSIS — R9389 Abnormal findings on diagnostic imaging of other specified body structures: Secondary | ICD-10-CM | POA: Diagnosis not present

## 2019-02-18 DIAGNOSIS — I10 Essential (primary) hypertension: Secondary | ICD-10-CM | POA: Diagnosis not present

## 2019-02-18 DIAGNOSIS — Z79899 Other long term (current) drug therapy: Secondary | ICD-10-CM | POA: Diagnosis not present

## 2019-02-18 DIAGNOSIS — R0602 Shortness of breath: Secondary | ICD-10-CM | POA: Diagnosis not present

## 2019-02-18 DIAGNOSIS — R600 Localized edema: Secondary | ICD-10-CM | POA: Diagnosis not present

## 2019-02-18 DIAGNOSIS — R0600 Dyspnea, unspecified: Secondary | ICD-10-CM | POA: Diagnosis not present

## 2019-02-22 ENCOUNTER — Other Ambulatory Visit: Payer: Self-pay | Admitting: Cardiology

## 2019-02-22 DIAGNOSIS — R935 Abnormal findings on diagnostic imaging of other abdominal regions, including retroperitoneum: Secondary | ICD-10-CM

## 2019-02-22 DIAGNOSIS — R591 Generalized enlarged lymph nodes: Secondary | ICD-10-CM

## 2019-03-03 ENCOUNTER — Other Ambulatory Visit: Payer: 59

## 2019-03-07 ENCOUNTER — Telehealth (HOSPITAL_COMMUNITY): Payer: Self-pay

## 2019-03-07 NOTE — Telephone Encounter (Signed)

## 2019-03-08 ENCOUNTER — Other Ambulatory Visit: Payer: Self-pay

## 2019-03-08 ENCOUNTER — Ambulatory Visit (INDEPENDENT_AMBULATORY_CARE_PROVIDER_SITE_OTHER): Payer: 59 | Admitting: Vascular Surgery

## 2019-03-08 ENCOUNTER — Encounter: Payer: Self-pay | Admitting: Vascular Surgery

## 2019-03-08 VITALS — BP 157/89 | HR 77 | Temp 97.6°F | Resp 20 | Ht 72.0 in | Wt 298.0 lb

## 2019-03-08 DIAGNOSIS — I890 Lymphedema, not elsewhere classified: Secondary | ICD-10-CM | POA: Diagnosis not present

## 2019-03-08 DIAGNOSIS — M7989 Other specified soft tissue disorders: Secondary | ICD-10-CM
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
## 2019-06-28 ENCOUNTER — Emergency Department (HOSPITAL_BASED_OUTPATIENT_CLINIC_OR_DEPARTMENT_OTHER): Payer: Self-pay

## 2019-06-28 ENCOUNTER — Inpatient Hospital Stay (HOSPITAL_BASED_OUTPATIENT_CLINIC_OR_DEPARTMENT_OTHER)
Admission: EM | Admit: 2019-06-28 | Discharge: 2019-07-01 | DRG: 189 | Disposition: A | Discharge status: Home / Self Care | Payer: Self-pay | Attending: Internal Medicine | Admitting: Internal Medicine

## 2019-06-28 ENCOUNTER — Other Ambulatory Visit: Payer: Self-pay

## 2019-06-28 ENCOUNTER — Encounter (HOSPITAL_BASED_OUTPATIENT_CLINIC_OR_DEPARTMENT_OTHER): Payer: Self-pay | Admitting: Emergency Medicine

## 2019-06-28 ENCOUNTER — Emergency Department (HOSPITAL_COMMUNITY): Payer: Self-pay

## 2019-06-28 DIAGNOSIS — G43909 Migraine, unspecified, not intractable, without status migrainosus: Secondary | ICD-10-CM | POA: Diagnosis present

## 2019-06-28 DIAGNOSIS — Z6841 Body Mass Index (BMI) 40.0 and over, adult: Secondary | ICD-10-CM

## 2019-06-28 DIAGNOSIS — F17200 Nicotine dependence, unspecified, uncomplicated: Secondary | ICD-10-CM | POA: Diagnosis present

## 2019-06-28 DIAGNOSIS — J449 Chronic obstructive pulmonary disease, unspecified: Secondary | ICD-10-CM | POA: Diagnosis present

## 2019-06-28 DIAGNOSIS — Z20822 Contact with and (suspected) exposure to covid-19: Secondary | ICD-10-CM | POA: Diagnosis present

## 2019-06-28 DIAGNOSIS — E662 Morbid (severe) obesity with alveolar hypoventilation: Secondary | ICD-10-CM | POA: Diagnosis present

## 2019-06-28 DIAGNOSIS — I5032 Chronic diastolic (congestive) heart failure: Secondary | ICD-10-CM | POA: Diagnosis present

## 2019-06-28 DIAGNOSIS — F17210 Nicotine dependence, cigarettes, uncomplicated: Secondary | ICD-10-CM | POA: Diagnosis present

## 2019-06-28 DIAGNOSIS — R590 Localized enlarged lymph nodes: Secondary | ICD-10-CM | POA: Diagnosis present

## 2019-06-28 DIAGNOSIS — Z79899 Other long term (current) drug therapy: Secondary | ICD-10-CM

## 2019-06-28 DIAGNOSIS — J9602 Acute respiratory failure with hypercapnia: Secondary | ICD-10-CM | POA: Diagnosis present

## 2019-06-28 DIAGNOSIS — J9601 Acute respiratory failure with hypoxia: Principal | ICD-10-CM | POA: Diagnosis present

## 2019-06-28 DIAGNOSIS — Z7982 Long term (current) use of aspirin: Secondary | ICD-10-CM

## 2019-06-28 DIAGNOSIS — F1121 Opioid dependence, in remission: Secondary | ICD-10-CM | POA: Diagnosis present

## 2019-06-28 DIAGNOSIS — Z79891 Long term (current) use of opiate analgesic: Secondary | ICD-10-CM

## 2019-06-28 DIAGNOSIS — G9340 Encephalopathy, unspecified: Secondary | ICD-10-CM | POA: Diagnosis present

## 2019-06-28 DIAGNOSIS — I110 Hypertensive heart disease with heart failure: Secondary | ICD-10-CM | POA: Diagnosis present

## 2019-06-28 DIAGNOSIS — R609 Edema, unspecified: Secondary | ICD-10-CM

## 2019-06-28 DIAGNOSIS — I890 Lymphedema, not elsewhere classified: Secondary | ICD-10-CM

## 2019-06-28 HISTORY — DX: Opioid dependence, uncomplicated: F11.20

## 2019-06-28 HISTORY — DX: Opioid dependence, in remission: F11.21

## 2019-06-28 HISTORY — DX: Lymphedema, not elsewhere classified: I89.0

## 2019-06-28 HISTORY — DX: Body Mass Index (BMI) 40.0 and over, adult: Z684

## 2019-06-28 HISTORY — DX: Nicotine dependence, unspecified, uncomplicated: F17.200

## 2019-06-28 HISTORY — DX: Morbid (severe) obesity due to excess calories: E66.01

## 2019-06-28 LAB — COMPREHENSIVE METABOLIC PANEL
ALT: 24 U/L (ref 0–44)
AST: 19 U/L (ref 15–41)
Albumin: 3.6 g/dL (ref 3.5–5.0)
Alkaline Phosphatase: 80 U/L (ref 38–126)
Anion gap: 10 (ref 5–15)
BUN: 19 mg/dL (ref 6–20)
CO2: 31 mmol/L (ref 22–32)
Calcium: 8.4 mg/dL — ABNORMAL LOW (ref 8.9–10.3)
Chloride: 98 mmol/L (ref 98–111)
Creatinine, Ser: 0.63 mg/dL (ref 0.61–1.24)
GFR calc Af Amer: >60 mL/min (ref >60)
GFR calc non Af Amer: >60 mL/min (ref >60)
Glucose, Bld: 97 mg/dL (ref 70–99)
Potassium: 4.3 mmol/L (ref 3.5–5.1)
Sodium: 139 mmol/L (ref 135–145)
Total Bilirubin: 0.3 mg/dL (ref 0.3–1.2)
Total Protein: 6.9 g/dL (ref 6.5–8.1)

## 2019-06-28 LAB — CBC WITH DIFFERENTIAL/PLATELET
Abs Immature Granulocytes: 0.02 10*3/uL (ref 0.00–0.07)
Basophils Absolute: 0 10*3/uL (ref 0.0–0.1)
Basophils Relative: 0 %
Eosinophils Absolute: 0.2 10*3/uL (ref 0.0–0.5)
Eosinophils Relative: 3 %
HCT: 37.5 % — ABNORMAL LOW (ref 39.0–52.0)
Hemoglobin: 11.6 g/dL — ABNORMAL LOW (ref 13.0–17.0)
Immature Granulocytes: 0 %
Lymphocytes Relative: 23 %
Lymphs Abs: 1.5 10*3/uL (ref 0.7–4.0)
MCH: 29.4 pg (ref 26.0–34.0)
MCHC: 30.9 g/dL (ref 30.0–36.0)
MCV: 94.9 fL (ref 80.0–100.0)
Monocytes Absolute: 0.5 10*3/uL (ref 0.1–1.0)
Monocytes Relative: 7 %
Neutro Abs: 4.4 10*3/uL (ref 1.7–7.7)
Neutrophils Relative %: 67 %
Platelets: 189 10*3/uL (ref 150–400)
RBC: 3.95 MIL/uL — ABNORMAL LOW (ref 4.22–5.81)
RDW: 14.8 % (ref 11.5–15.5)
WBC: 6.7 10*3/uL (ref 4.0–10.5)
nRBC: 0 % (ref 0.0–0.2)

## 2019-06-28 LAB — URINALYSIS, MICROSCOPIC (REFLEX): Squamous Epithelial / HPF: NONE SEEN (ref 0–5)

## 2019-06-28 LAB — POCT I-STAT 7, (LYTES, BLD GAS, ICA,H+H)
Acid-Base Excess: 7 mmol/L — ABNORMAL HIGH (ref 0.0–2.0)
Bicarbonate: 35.1 mmol/L — ABNORMAL HIGH (ref 20.0–28.0)
Calcium, Ion: 1.23 mmol/L (ref 1.15–1.40)
HCT: 36 % — ABNORMAL LOW (ref 39.0–52.0)
Hemoglobin: 12.2 g/dL — ABNORMAL LOW (ref 13.0–17.0)
O2 Saturation: 78 %
Potassium: 4.2 mmol/L (ref 3.5–5.1)
Sodium: 140 mmol/L (ref 135–145)
TCO2: 37 mmol/L — ABNORMAL HIGH (ref 22–32)
pCO2 arterial: 66 mmHg (ref 32.0–48.0)
pH, Arterial: 7.333 — ABNORMAL LOW (ref 7.350–7.450)
pO2, Arterial: 47 mmHg — ABNORMAL LOW (ref 83.0–108.0)

## 2019-06-28 LAB — URINALYSIS, ROUTINE W REFLEX MICROSCOPIC
Bilirubin Urine: NEGATIVE
Glucose, UA: NEGATIVE mg/dL
Ketones, ur: NEGATIVE mg/dL
Leukocytes,Ua: NEGATIVE
Nitrite: NEGATIVE
Protein, ur: NEGATIVE mg/dL
Specific Gravity, Urine: 1.025 (ref 1.005–1.030)
pH: 6 (ref 5.0–8.0)

## 2019-06-28 LAB — AMMONIA: Ammonia: 22 umol/L (ref 9–35)

## 2019-06-28 LAB — TROPONIN I (HIGH SENSITIVITY)
Troponin I (High Sensitivity): 9 ng/L (ref <18)
Troponin I (High Sensitivity): 9 ng/L (ref <18)

## 2019-06-28 LAB — BRAIN NATRIURETIC PEPTIDE: B Natriuretic Peptide: 24.4 pg/mL (ref 0.0–100.0)

## 2019-06-28 MED ORDER — FUROSEMIDE 10 MG/ML IJ SOLN
40.0000 mg | Freq: Once | INTRAMUSCULAR | Status: AC
  Administered 2019-06-28: 40 mg via INTRAVENOUS
  Filled 2019-06-28: qty 4

## 2019-06-28 MED ORDER — IOHEXOL 350 MG/ML SOLN
100.0000 mL | Freq: Once | INTRAVENOUS | Status: AC | PRN
  Administered 2019-06-28: 100 mL via INTRAVENOUS

## 2019-06-28 NOTE — ED Notes (Signed)
Patient removed Bipap stating he was choking and he was not gong to put it back on. I placed patient on 3L [place] sats were 94%. Notified physician.

## 2019-06-28 NOTE — ED Notes (Signed)
ABG drawn on room air, SpO2 reading 82-86%.  Patient placed back on 2l/m O2 after drawing ABG.  Pateint tol well.  EDP aware
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
## 2019-06-28 NOTE — ED Notes (Signed)
Pt ambulated to RR unassisted 

## 2019-06-28 NOTE — ED Notes (Signed)
Observed hypopnea desats into low 80s, placed on 2l/m [place].  Wakens and SpO2 increase 90's.

## 2019-06-28 NOTE — ED Notes (Signed)
PT to US

## 2019-06-28 NOTE — ED Triage Notes (Addendum)
Leg swelling x 8 months. Has seen a vascular surgeon and PCP. Denies SOB. Pt also states he has frequently fallen asleep for no reason without feeling tired.

## 2019-06-29 ENCOUNTER — Encounter (HOSPITAL_BASED_OUTPATIENT_CLINIC_OR_DEPARTMENT_OTHER): Payer: Self-pay | Admitting: Internal Medicine

## 2019-06-29 DIAGNOSIS — F1121 Opioid dependence, in remission: Secondary | ICD-10-CM | POA: Diagnosis present

## 2019-06-29 DIAGNOSIS — J9601 Acute respiratory failure with hypoxia: Principal | ICD-10-CM

## 2019-06-29 DIAGNOSIS — I890 Lymphedema, not elsewhere classified: Secondary | ICD-10-CM

## 2019-06-29 DIAGNOSIS — J449 Chronic obstructive pulmonary disease, unspecified: Secondary | ICD-10-CM | POA: Diagnosis present

## 2019-06-29 DIAGNOSIS — F17200 Nicotine dependence, unspecified, uncomplicated: Secondary | ICD-10-CM | POA: Diagnosis present

## 2019-06-29 DIAGNOSIS — J9602 Acute respiratory failure with hypercapnia: Secondary | ICD-10-CM

## 2019-06-29 DIAGNOSIS — Z6841 Body Mass Index (BMI) 40.0 and over, adult: Secondary | ICD-10-CM

## 2019-06-29 LAB — CREATININE, SERUM
Creatinine, Ser: 0.82 mg/dL (ref 0.61–1.24)
GFR calc Af Amer: >60 mL/min (ref >60)
GFR calc non Af Amer: >60 mL/min (ref >60)

## 2019-06-29 LAB — URINE CULTURE: Culture: NO GROWTH

## 2019-06-29 LAB — SARS CORONAVIRUS 2 (TAT 6-24 HRS): SARS Coronavirus 2: NEGATIVE

## 2019-06-29 LAB — CBC
HCT: 39.3 % (ref 39.0–52.0)
Hemoglobin: 11.9 g/dL — ABNORMAL LOW (ref 13.0–17.0)
MCH: 29.2 pg (ref 26.0–34.0)
MCHC: 30.3 g/dL (ref 30.0–36.0)
MCV: 96.3 fL (ref 80.0–100.0)
Platelets: 183 10*3/uL (ref 150–400)
RBC: 4.08 MIL/uL — ABNORMAL LOW (ref 4.22–5.81)
RDW: 15 % (ref 11.5–15.5)
WBC: 5.1 10*3/uL (ref 4.0–10.5)
nRBC: 0 % (ref 0.0–0.2)

## 2019-06-29 LAB — RAPID URINE DRUG SCREEN, HOSP PERFORMED
Amphetamines: NOT DETECTED
Barbiturates: NOT DETECTED
Benzodiazepines: NOT DETECTED
Cocaine: NOT DETECTED
Opiates: POSITIVE — AB
Tetrahydrocannabinol: NOT DETECTED

## 2019-06-29 LAB — HIV ANTIBODY (ROUTINE TESTING W REFLEX): HIV Screen 4th Generation wRfx: NONREACTIVE

## 2019-06-29 MED ORDER — METHOCARBAMOL 500 MG PO TABS
500.0000 mg | ORAL_TABLET | Freq: Three times a day (TID) | ORAL | Status: DC | PRN
  Administered 2019-06-30: 500 mg via ORAL
  Filled 2019-06-29: qty 1

## 2019-06-29 MED ORDER — ONDANSETRON HCL 4 MG PO TABS: 4.0000 mg | ORAL_TABLET | Freq: Four times a day (QID) | ORAL | Status: DC | PRN

## 2019-06-29 MED ORDER — LOPERAMIDE HCL 2 MG PO CAPS: 2.0000 mg | ORAL_CAPSULE | ORAL | Status: DC | PRN

## 2019-06-29 MED ORDER — SODIUM CHLORIDE 0.9% FLUSH
3.0000 mL | Freq: Two times a day (BID) | INTRAVENOUS | Status: DC
  Administered 2019-06-29 – 2019-07-01 (×4): 3 mL via INTRAVENOUS

## 2019-06-29 MED ORDER — ASPIRIN EC 81 MG PO TBEC
81.0000 mg | DELAYED_RELEASE_TABLET | Freq: Every day | ORAL | Status: DC
  Administered 2019-06-30 – 2019-07-01 (×2): 81 mg via ORAL
  Filled 2019-06-29 (×2): qty 1

## 2019-06-29 MED ORDER — ACETAMINOPHEN 325 MG PO TABS: 650.0000 mg | ORAL_TABLET | Freq: Four times a day (QID) | ORAL | Status: DC | PRN

## 2019-06-29 MED ORDER — HYDROXYZINE HCL 25 MG PO TABS: 25.0000 mg | ORAL_TABLET | Freq: Four times a day (QID) | ORAL | Status: DC | PRN

## 2019-06-29 MED ORDER — NAPROXEN 250 MG PO TABS: 500.0000 mg | ORAL_TABLET | Freq: Two times a day (BID) | ORAL | Status: DC | PRN

## 2019-06-29 MED ORDER — BISACODYL 5 MG PO TBEC: 5.0000 mg | DELAYED_RELEASE_TABLET | Freq: Every day | ORAL | Status: DC | PRN

## 2019-06-29 MED ORDER — ENOXAPARIN SODIUM 40 MG/0.4ML ~~LOC~~ SOLN
40.0000 mg | SUBCUTANEOUS | Status: DC
  Administered 2019-06-29: 40 mg via SUBCUTANEOUS
  Filled 2019-06-29: qty 0.4

## 2019-06-29 MED ORDER — SODIUM CHLORIDE 0.9% FLUSH: 3.0000 mL | INTRAVENOUS | Status: DC | PRN

## 2019-06-29 MED ORDER — POLYETHYLENE GLYCOL 3350 17 G PO PACK: 17.0000 g | PACK | Freq: Every day | ORAL | Status: DC | PRN

## 2019-06-29 MED ORDER — OXYCODONE-ACETAMINOPHEN 5-325 MG PO TABS
1.0000 | ORAL_TABLET | Freq: Four times a day (QID) | ORAL | Status: DC | PRN
  Administered 2019-06-29 – 2019-06-30 (×2): 1 via ORAL
  Filled 2019-06-29 (×2): qty 1

## 2019-06-29 MED ORDER — HYDRALAZINE HCL 20 MG/ML IJ SOLN: 5.0000 mg | INTRAMUSCULAR | Status: DC | PRN

## 2019-06-29 MED ORDER — SODIUM CHLORIDE 0.9 % IV SOLN: 250.0000 mL | INTRAVENOUS | Status: DC | PRN

## 2019-06-29 MED ORDER — HYDROMORPHONE HCL 1 MG/ML IJ SOLN
2.0000 mg | Freq: Once | INTRAMUSCULAR | Status: AC
  Administered 2019-06-29: 2 mg via INTRAVENOUS
  Filled 2019-06-29: qty 2

## 2019-06-29 MED ORDER — ENOXAPARIN SODIUM 40 MG/0.4ML ~~LOC~~ SOLN
40.0000 mg | SUBCUTANEOUS | Status: DC
  Administered 2019-06-30: 40 mg via SUBCUTANEOUS
  Filled 2019-06-29: qty 0.4

## 2019-06-29 MED ORDER — ONDANSETRON HCL 4 MG/2ML IJ SOLN: 4.0000 mg | Freq: Four times a day (QID) | INTRAMUSCULAR | Status: DC | PRN

## 2019-06-29 MED ORDER — CEFAZOLIN SODIUM-DEXTROSE 2-4 GM/100ML-% IV SOLN
2.0000 g | Freq: Three times a day (TID) | INTRAVENOUS | Status: DC
  Administered 2019-06-29 – 2019-07-01 (×5): 2 g via INTRAVENOUS
  Filled 2019-06-29 (×5): qty 100

## 2019-06-29 MED ORDER — NICOTINE 14 MG/24HR TD PT24
14.0000 mg | MEDICATED_PATCH | Freq: Every day | TRANSDERMAL | Status: DC
  Filled 2019-06-29 (×2): qty 1

## 2019-06-29 MED ORDER — DOCUSATE SODIUM 100 MG PO CAPS
100.0000 mg | ORAL_CAPSULE | Freq: Two times a day (BID) | ORAL | Status: DC
  Administered 2019-06-29 – 2019-07-01 (×5): 100 mg via ORAL
  Filled 2019-06-29 (×5): qty 1

## 2019-06-29 MED ORDER — METOPROLOL SUCCINATE ER 25 MG PO TB24
12.5000 mg | ORAL_TABLET | Freq: Every day | ORAL | Status: DC
  Administered 2019-06-29 – 2019-07-01 (×3): 12.5 mg via ORAL
  Filled 2019-06-29 (×3): qty 1

## 2019-06-29 MED ORDER — DICYCLOMINE HCL 20 MG PO TABS
20.0000 mg | ORAL_TABLET | Freq: Four times a day (QID) | ORAL | Status: DC | PRN
  Filled 2019-06-29: qty 1

## 2019-06-29 MED ORDER — METHADONE HCL 10 MG/ML PO CONC
170.0000 mg | Freq: Every day | ORAL | Status: DC
  Administered 2019-06-29 – 2019-06-30 (×2): 170 mg via ORAL
  Filled 2019-06-29 (×3): qty 17

## 2019-06-29 MED ORDER — HYDROMORPHONE HCL 1 MG/ML IJ SOLN
1.0000 mg | Freq: Once | INTRAMUSCULAR | Status: AC
  Administered 2019-06-29: 1 mg via INTRAVENOUS
  Filled 2019-06-29: qty 1

## 2019-06-29 MED ORDER — OXYCODONE HCL 5 MG PO TABS
5.0000 mg | ORAL_TABLET | Freq: Four times a day (QID) | ORAL | Status: DC | PRN
  Administered 2019-06-29 – 2019-06-30 (×2): 5 mg via ORAL
  Filled 2019-06-29 (×2): qty 1

## 2019-06-29 MED ORDER — ACETAMINOPHEN 650 MG RE SUPP: 650.0000 mg | Freq: Four times a day (QID) | RECTAL | Status: DC | PRN

## 2019-06-29 MED ORDER — OXYCODONE-ACETAMINOPHEN 10-325 MG PO TABS: 1.0000 | ORAL_TABLET | Freq: Four times a day (QID) | ORAL | Status: DC | PRN

## 2019-06-29 MED ORDER — LISINOPRIL 20 MG PO TABS
20.0000 mg | ORAL_TABLET | Freq: Every day | ORAL | Status: DC
  Administered 2019-06-29 – 2019-07-01 (×3): 20 mg via ORAL
  Filled 2019-06-29 (×3): qty 1
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
## 2019-06-29 NOTE — ED Notes (Signed)
Report provided to [facility] for transport, [phone]

## 2019-06-29 NOTE — ED Notes (Signed)
Tele Dinner ordered 

## 2019-06-29 NOTE — ED Notes (Signed)
Patient resting on 3L [place] oxygen saturations 93%. Will continue to monitor.

## 2019-06-29 NOTE — ED Provider Notes (Addendum)
Patient is resting comfortably.  He is no longer on BiPAP and has not been for hours.  Is on 3 L.  Discussed with Dr. Ophelia Charter, will change his bed admission from progressive to telemetry.  Unfortunately he is still waiting.   Pricilla Loveless, MD 06/29/19 [phone]  Patient is in a lot of pain again.  He is due his methadone but we do not have that at this facility.  He is due for a long wait for a telemetry bed and so I discussed with Dr. Jeraldine Loots at [facility] and we will transfer him to the ED there.  Consult hospitalist there to let them know to start admission process again.   Pricilla Loveless, MD 06/29/19 [phone]

## 2019-06-29 NOTE — ED Notes (Signed)
Pt reports increased pain upon waking, states previously taking 120mg  Methadone, wishes to speak to EDP regarding pain management while awaiting inpatient bed.

## 2019-06-29 NOTE — ED Notes (Signed)
Pt to [facility] ER for inpatient hold.  Report called to [facility] RN charge, Accepting physician Dr Jeraldine Loots

## 2019-06-29 NOTE — Plan of Care (Signed)

## 2019-06-29 NOTE — Progress Notes (Signed)
[facility] to [facility] transfer, per Dr. Leafy Half:  49 year old male with past medical history of COPD presenting to med [facility] emergency department with confusion, diffuse swelling -near anasarca. Emergency department patient found to be tachypneic and confused. ABG revealing pH 7.33, PCO2 of 66, PO2 of 47 and BiPAP initiated. CT chest reveals no acute disease. Covid pending but will likely be negative. For both hypercapnic respiratory failure and hypercapnic encephalopathy. Concerning patient's anasarca, etiology is unknown, 40 mg IV Lasix given. Patient accepted to stepdown unit bed.  I was called back by [facility] MD.  Dr. Criss Alvine reports that the patient is no longer on BIPAP.  SOB, hypoxic, hypercarbic.  Placed on BIPAP for a few hours.  He removed the BIPAP and has done ok on 2-3L O2.  Okay to change to telemetry while awaiting for a bed.   Georgana Curio, M.D.

## 2019-06-30 ENCOUNTER — Inpatient Hospital Stay (HOSPITAL_COMMUNITY): Payer: Self-pay

## 2019-06-30 DIAGNOSIS — Z6841 Body Mass Index (BMI) 40.0 and over, adult: Secondary | ICD-10-CM

## 2019-06-30 DIAGNOSIS — I5031 Acute diastolic (congestive) heart failure: Secondary | ICD-10-CM

## 2019-06-30 DIAGNOSIS — F1121 Opioid dependence, in remission: Secondary | ICD-10-CM

## 2019-06-30 DIAGNOSIS — I890 Lymphedema, not elsewhere classified: Secondary | ICD-10-CM

## 2019-06-30 LAB — CBC WITH DIFFERENTIAL/PLATELET
Abs Immature Granulocytes: 0.01 10*3/uL (ref 0.00–0.07)
Basophils Absolute: 0 10*3/uL (ref 0.0–0.1)
Basophils Relative: 1 %
Eosinophils Absolute: 0.2 10*3/uL (ref 0.0–0.5)
Eosinophils Relative: 3 %
HCT: 40.4 % (ref 39.0–52.0)
Hemoglobin: 12.1 g/dL — ABNORMAL LOW (ref 13.0–17.0)
Immature Granulocytes: 0 %
Lymphocytes Relative: 26 %
Lymphs Abs: 1.7 10*3/uL (ref 0.7–4.0)
MCH: 29.2 pg (ref 26.0–34.0)
MCHC: 30 g/dL (ref 30.0–36.0)
MCV: 97.3 fL (ref 80.0–100.0)
Monocytes Absolute: 0.5 10*3/uL (ref 0.1–1.0)
Monocytes Relative: 7 %
Neutro Abs: 4.2 10*3/uL (ref 1.7–7.7)
Neutrophils Relative %: 63 %
Platelets: 211 10*3/uL (ref 150–400)
RBC: 4.15 MIL/uL — ABNORMAL LOW (ref 4.22–5.81)
RDW: 14.7 % (ref 11.5–15.5)
WBC: 6.6 10*3/uL (ref 4.0–10.5)
nRBC: 0 % (ref 0.0–0.2)

## 2019-06-30 LAB — BASIC METABOLIC PANEL
Anion gap: 9 (ref 5–15)
BUN: 19 mg/dL (ref 6–20)
CO2: 34 mmol/L — ABNORMAL HIGH (ref 22–32)
Calcium: 8.2 mg/dL — ABNORMAL LOW (ref 8.9–10.3)
Chloride: 95 mmol/L — ABNORMAL LOW (ref 98–111)
Creatinine, Ser: 0.91 mg/dL (ref 0.61–1.24)
GFR calc Af Amer: >60 mL/min (ref >60)
GFR calc non Af Amer: >60 mL/min (ref >60)
Glucose, Bld: 114 mg/dL — ABNORMAL HIGH (ref 70–99)
Potassium: 4.2 mmol/L (ref 3.5–5.1)
Sodium: 138 mmol/L (ref 135–145)

## 2019-06-30 LAB — ECHOCARDIOGRAM COMPLETE
Height: 72 in
Weight: 4977.1 oz

## 2019-06-30 MED ORDER — METHADONE HCL 10 MG PO TABS
170.0000 mg | ORAL_TABLET | Freq: Every day | ORAL | Status: DC
  Administered 2019-07-01: 170 mg via ORAL
  Filled 2019-06-30: qty 17

## 2019-06-30 MED ORDER — TRAZODONE HCL 50 MG PO TABS
50.0000 mg | ORAL_TABLET | Freq: Every evening | ORAL | Status: DC | PRN
  Administered 2019-06-30: 50 mg via ORAL
  Filled 2019-06-30: qty 1

## 2019-06-30 MED ORDER — FUROSEMIDE 10 MG/ML IJ SOLN
40.0000 mg | Freq: Once | INTRAMUSCULAR | Status: AC
  Administered 2019-06-30: 40 mg via INTRAVENOUS
  Filled 2019-06-30: qty 4
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
## 2019-06-30 NOTE — Plan of Care (Signed)
  Problem: Education: Goal: Knowledge of General Education information will improve Description: Including pain rating scale, medication(s)/side effects and non-pharmacologic comfort measures Outcome: Progressing   Problem: Health Behavior/Discharge Planning: Goal: Ability to manage health-related needs will improve Outcome: Progressing   Problem: Activity: Goal: Risk for activity intolerance will decrease Outcome: Progressing  Patient up in room performingADL's, encouraged Patient to  wear O2 at alltimes To prevent dyspnea.

## 2019-06-30 NOTE — Progress Notes (Signed)
  Echocardiogram 2D Echocardiogram has been performed.  Nathaniel [name] 06/30/2019, 9:15 AM

## 2019-07-01 LAB — BASIC METABOLIC PANEL
Anion gap: 7 (ref 5–15)
BUN: 20 mg/dL (ref 6–20)
CO2: 35 mmol/L — ABNORMAL HIGH (ref 22–32)
Calcium: 8.4 mg/dL — ABNORMAL LOW (ref 8.9–10.3)
Chloride: 97 mmol/L — ABNORMAL LOW (ref 98–111)
Creatinine, Ser: 0.79 mg/dL (ref 0.61–1.24)
GFR calc Af Amer: >60 mL/min (ref >60)
GFR calc non Af Amer: >60 mL/min (ref >60)
Glucose, Bld: 94 mg/dL (ref 70–99)
Potassium: 4.1 mmol/L (ref 3.5–5.1)
Sodium: 139 mmol/L (ref 135–145)

## 2019-07-01 LAB — TSH: TSH: 2.059 u[IU]/mL (ref 0.350–4.500)

## 2019-07-01 MED ORDER — FUROSEMIDE 20 MG PO TABS
ORAL_TABLET | ORAL | 1 refills | Status: DC
Start: 2019-07-01 — End: 2023-05-22

## 2019-07-01 MED ORDER — POTASSIUM CHLORIDE ER 20 MEQ PO TBCR: 20.0000 meq | EXTENDED_RELEASE_TABLET | Freq: Every day | ORAL | 0 refills | Status: DC

## 2019-07-01 NOTE — TOC Progression Note (Addendum)
Transition of Care [facility]) - Progression Note    Patient Details  Name: Nathaniel [name] MRN: [id number] Date of Birth: 1970/02/18  Transition of Care [facility]) CM/SW [place], [place] Phone Number: 07/01/2019, 1:33 PM  Clinical Narrative:     Met with patient for 02 referral. Pt has no insurance. States his insurance will start once he begins new job in the next week or two. CSW to look into options for 02 without insurance.   CSW contacted zack from [employer]. Zack explained that pt could get 02 under hardship and have no pay for first 30 days if he has a card on file and that once pt obtains insurance, he could have coverage.   Presented option to pt. Pt will discuss option with Zack from [employer].   CSW informed by Edwyna Ready that pt currently refuses 02 option due to having to put a debit card on file despite not having a bill. Patient explained he will take hardship application and call [employer] at later time if he wants home 02 set up.   CSW met with pt and explained nurses concerns about his 02 level. Attempted to clarify whether or not pt did not have a debit card to put on file or he didn't want to put one on file.Pt gave unclear answer. Pt stated the only person that could help is his grandfather who is 6+ years old and pt was unwilling to ask grandfather for help. Pt explained he still wanted to leave the hospital.        Expected Discharge Plan and Services           Expected Discharge Date: 07/01/19                                     Social Determinants of Health (SDOH) Interventions    Readmission Risk Interventions No flowsheet data found.

## 2019-07-01 NOTE — Discharge Instructions (Signed)

## 2019-07-01 NOTE — Discharge Summary (Signed)
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 tablet by mouth 4 (four) times daily as needed.   Potassium Chloride ER 20 MEQ Tbcr Take 20 mEq by mouth daily. What changed:   medication strength  how much to take   SUMAtriptan 100 MG tablet Commonly known as: IMITREX Take 100 mg by mouth daily as needed for migraine. May repeat in 2 hours if headache persists or recurs.   verapamil 120 MG CR tablet Commonly known as: CALAN-SR Take 1 tablet by mouth daily as needed for headache.            Durable Medical Equipment  (From admission, onward)         Start     Ordered   07/01/19 1204  For home use only DME oxygen  Once    Question Answer Comment  Length of Need 6 Months   Mode or (Route) Nasal cannula   Liters per Minute 2   Frequency Continuous (stationary and portable oxygen unit needed)   Oxygen conserving device Yes   Oxygen delivery system Gas      07/01/19 1203              TOTAL DISCHARGE TIME: 35 minutes  Crisanto Nied [employer] on [email or web address]  07/01/2019, 2:11 PM

## 2019-07-01 NOTE — Plan of Care (Signed)
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
## 2019-07-01 NOTE — Progress Notes (Signed)
SATURATION QUALIFICATIONS: (This note is used to comply with regulatory documentation for home oxygen)  Patient Saturations on Room Air at Rest = 93%, desats to 86% with talking  Patient Saturations on Room Air while Ambulating = 85%  Patient Saturations on 2 Liters of oxygen while Ambulating = 91%  Please briefly explain why patient needs home oxygen:Pt needed O2 on RA with talking at rest and with activity.   Hula Tasso W,PT Acute Rehabilitation Services Pager:  [phone]  Office:  [phone]

## 2021-08-13 ENCOUNTER — Emergency Department (HOSPITAL_COMMUNITY)
Admission: EM | Admit: 2021-08-13 | Discharge: 2021-08-13 | Disposition: A | Discharge status: Home / Self Care | Payer: 59 | Attending: Student | Admitting: Student

## 2021-08-13 ENCOUNTER — Encounter (HOSPITAL_COMMUNITY): Payer: Self-pay

## 2021-08-13 ENCOUNTER — Emergency Department (HOSPITAL_COMMUNITY): Payer: 59

## 2021-08-13 ENCOUNTER — Other Ambulatory Visit: Payer: Self-pay

## 2021-08-13 DIAGNOSIS — Y92000 Kitchen of unspecified non-institutional (private) residence as  the place of occurrence of the external cause: Secondary | ICD-10-CM | POA: Insufficient documentation

## 2021-08-13 DIAGNOSIS — W268XXA Contact with other sharp object(s), not elsewhere classified, initial encounter: Secondary | ICD-10-CM | POA: Insufficient documentation

## 2021-08-13 DIAGNOSIS — S81811A Laceration without foreign body, right lower leg, initial encounter: Secondary | ICD-10-CM | POA: Diagnosis not present

## 2021-08-13 DIAGNOSIS — S8991XA Unspecified injury of right lower leg, initial encounter: Secondary | ICD-10-CM | POA: Diagnosis present

## 2021-08-13 HISTORY — DX: Essential (primary) hypertension: I10

## 2021-08-13 MED ORDER — FENTANYL CITRATE PF 50 MCG/ML IJ SOSY
50.0000 ug | PREFILLED_SYRINGE | Freq: Once | INTRAMUSCULAR | Status: AC
  Administered 2021-08-13: 50 ug via INTRAMUSCULAR
  Filled 2021-08-13: qty 1

## 2021-08-13 MED ORDER — LIDOCAINE-EPINEPHRINE (PF) 1 %-1:200000 IJ SOLN
20.0000 mL | Freq: Once | INTRAMUSCULAR | Status: AC
  Administered 2021-08-13: 20 mL
  Filled 2021-08-13: qty 30

## 2021-08-13 NOTE — ED Provider Notes (Signed)
[facility] EMERGENCY DEPARTMENT Provider Note   CSN: [id number] Arrival date & time: 08/13/21  2100     History Chief Complaint  Patient presents with   Laceration    Nathaniel [name] is a 51 y.o. male patient presents to the emerged department with a right anterior shin laceration that occurred just prior to arrival.  Patient states he was going to the kitchen to get some food when he scraped his leg against the corner of the dresser causing a laceration.   Laceration      Home Medications Prior to Admission medications   Not on File      Allergies    Patient has no known allergies.    Review of Systems   Review of Systems  All other systems reviewed and are negative.   Physical Exam Updated Vital Signs BP (!) 182/86 (BP Location: Left Arm)   Pulse 88   Temp 98.6 F (37 C) (Oral)   Resp 15   Ht 6\' 2"  (1.88 m)   Wt 133.8 kg   SpO2 96%   BMI 37.88 kg/m  Physical Exam Vitals and nursing note reviewed.  Constitutional:      Appearance: Normal appearance.  HENT:     Head: Normocephalic and atraumatic.  Eyes:     General:        Right eye: No discharge.        Left eye: No discharge.     Conjunctiva/sclera: Conjunctivae normal.  Pulmonary:     Effort: Pulmonary effort is normal.  Skin:    General: Skin is warm and dry.     Findings: No rash.     Comments: Right linear 12cm laceration to the lateral tibia. Venous oozing but controlled with pressure. No pulsatile bleeding.   Neurological:     General: No focal deficit present.     Mental Status: He is alert.  Psychiatric:        Mood and Affect: Mood normal.        Behavior: Behavior normal.     ED Results / Procedures / Treatments   Labs (all labs ordered are listed, but only abnormal results are displayed) Labs Reviewed - No data to display  EKG None  Radiology DG Tibia/Fibula Right  Result Date: 08/13/2021 CLINICAL DATA:  Laceration, rule out foreign body EXAM: RIGHT TIBIA AND FIBULA - 2  VIEW COMPARISON:  None Available. FINDINGS: No fracture or dislocation is seen. The joint spaces are preserved. The visualized soft tissues are unremarkable. No radiopaque foreign body is seen. IMPRESSION: No radiopaque foreign body is seen. Electronically Signed   By: 10/14/2021 M.D.   On: 08/13/2021 22:25    Procedures .09/11/2023Laceration Repair  Date/Time: 08/13/2021 11:21 PM  Performed by: 10/14/2021, PA-C Authorized by: Teressa Lower, PA-C   Consent:    Consent obtained:  Verbal   Consent given by:  Patient   Risks, benefits, and alternatives were discussed: yes     Risks discussed:  Infection and need for additional repair   Alternatives discussed:  No treatment Universal protocol:    Procedure explained and questions answered to patient or proxy's satisfaction: yes     Relevant documents present and verified: no     Test results available: yes     Imaging studies available: yes     Required blood products, implants, devices, and special equipment available: no     Site/side marked: no     Immediately prior to procedure, a  time out was called: no     Patient identity confirmed:  Verbally with patient and arm band Anesthesia:    Anesthesia method:  Local infiltration   Local anesthetic:  Lidocaine 1% WITH epi Laceration details:    Location:  Leg   Leg location:  R lower leg   Length (cm):  12   Depth (mm):  5 Pre-procedure details:    Preparation:  Patient was prepped and draped in usual sterile fashion Exploration:    Limited defect created (wound extended): no     Hemostasis achieved with:  Epinephrine and direct pressure   Imaging obtained: x-ray     Imaging outcome: foreign body not noted     Wound exploration: wound explored through full range of motion and entire depth of wound visualized     Wound extent: areolar tissue violated     Wound extent: no fascia violation noted, no foreign bodies/material noted, no muscle damage noted, no nerve damage noted,  no tendon damage noted, no underlying fracture noted and no vascular damage noted   Treatment:    Area cleansed with:  Povidone-iodine and saline   Irrigation solution:  Sterile saline   Irrigation volume:  1L   Irrigation method:  Pressure wash   Visualized foreign bodies/material removed: no     Debridement:  None   Undermining:  None Skin repair:    Repair method:  Sutures   Suture size:  5-0   Suture material:  Prolene   Suture technique:  Simple interrupted and horizontal mattress   Number of sutures: 11 simple interupted sutures and 3 horizontal mattress. Approximation:    Approximation:  Close Repair type:    Repair type:  Intermediate Post-procedure details:    Dressing:  Non-adherent dressing   Procedure completion:  Tolerated well, no immediate complications Comments:     This wound was difficult to well approximate secondary to the venous stasis dermatitis.  There were chunks of skin missing towards the middle aspect of the wound.  I closed the wound with 11 simple interrupted sutures and 3 horizontal mattress.  Bleeding was controlled with direct pressure and with epinephrine.  Will place dressing over the wound.    Medications Ordered in ED Medications  fentaNYL (SUBLIMAZE) injection 50 mcg (50 mcg Intramuscular Given 08/13/21 2139)  lidocaine-EPINEPHrine (PF) (XYLOCAINE-EPINEPHrine) 1 %-1:200000 (PF) injection 20 mL (20 mLs Infiltration Given by Other 08/13/21 2201)    ED Course/ Medical Decision Making/ A&P                           Medical Decision Making Nathaniel [name] is a 51 y.o. male patient presents to the emergency department today for further evaluation of a leg laceration.  There was quite a bit of venous blood that was somewhat controlled with direct pressure.  After anesthetizing the wound with epinephrine bleeding was controlled further.  I ordered imaging over the right leg which was negative for any foreign body.  I personally reviewed this image and agree  with the radiologist interpretation. Tetanus is up-to-date per patient.  As mentioned the procedure note this was a difficult wound to close secondary to the venous stasis changes that he had on his legs.  I will place a dressing over the leg.  He will follow-up either in the emergency apartment or with his primary care doctor in 7 to 10 days for suture removal.  He is safe for discharge at this time.  Amount and/or Complexity of Data Reviewed Radiology: ordered.  Risk Prescription drug management.    Final Clinical Impression(s) / ED Diagnoses Final diagnoses:  Laceration of right lower extremity, initial encounter    Rx / DC Orders ED Discharge Orders     None         Jolyn Lent 08/13/21 2327    Glendora Score, MD 08/16/21 1140

## 2021-08-13 NOTE — ED Notes (Signed)
Placed pt on 2L O2 per the RN due to pt being in the 80s O2 on room air.

## 2021-08-13 NOTE — ED Triage Notes (Signed)
Pt arrived from home via [facility] w c/o approx 7 in laceration to RLE caused by running into wooden dresser.

## 2021-08-13 NOTE — Discharge Instructions (Signed)
Please keep wound clean and dry.  You can either follow-up here or urgent care or your primary care doctor in 7 to 10 days for suture removal.  Please return sooner if you start seeing any pus weeping from the area, fevers, chills, or surrounding redness.

## 2021-08-14 ENCOUNTER — Encounter (HOSPITAL_BASED_OUTPATIENT_CLINIC_OR_DEPARTMENT_OTHER): Payer: Self-pay | Admitting: Internal Medicine

## 2022-07-06 ENCOUNTER — Emergency Department (HOSPITAL_COMMUNITY)
Admission: EM | Admit: 2022-07-06 | Discharge: 2022-07-06 | Disposition: A | Discharge status: Home / Self Care | Payer: Medicare Other | Attending: Emergency Medicine | Admitting: Emergency Medicine

## 2022-07-06 DIAGNOSIS — S81812A Laceration without foreign body, left lower leg, initial encounter: Secondary | ICD-10-CM | POA: Diagnosis not present

## 2022-07-06 DIAGNOSIS — W2203XA Walked into furniture, initial encounter: Secondary | ICD-10-CM | POA: Insufficient documentation

## 2022-07-06 DIAGNOSIS — S8992XA Unspecified injury of left lower leg, initial encounter: Secondary | ICD-10-CM | POA: Diagnosis present

## 2022-07-06 MED ORDER — ACETAMINOPHEN 325 MG PO TABS
650.0000 mg | ORAL_TABLET | Freq: Once | ORAL | Status: AC
Start: 2022-07-06 — End: 2022-07-06
  Administered 2022-07-06: 650 mg via ORAL
  Filled 2022-07-06: qty 2

## 2022-07-06 MED ORDER — LIDOCAINE-EPINEPHRINE (PF) 2 %-1:200000 IJ SOLN
20.0000 mL | Freq: Once | INTRAMUSCULAR | Status: AC
  Administered 2022-07-06: 20 mL
  Filled 2022-07-06: qty 20

## 2022-07-06 NOTE — ED Provider Notes (Signed)
[place] EMERGENCY DEPARTMENT AT [facility]  Provider Note  CSN: [id number] Arrival date & time: 07/06/22 0102  History Chief Complaint  Patient presents with   Laceration    Patient was at home and hit his leg on a piece of furniture with metal sticking out of it.     Nathaniel [name] is a 52 y.o. male reports he was walking to the kitchen just prior to arrival when he struck his L leg on a piece of furniture with metal sticking out of it, sustaining lacerations to shin. TDAP is UTD.    Home Medications Prior to Admission medications   Medication Sig Start Date End Date Taking? Authorizing Provider  Aspirin-Acetaminophen-Caffeine (GOODY HEADACHE PO) Take 1 packet by mouth daily as needed (headache/pain).     [provider]  cloNIDine (CATAPRES) 0.1 MG tablet Take 1 tablet by mouth daily as needed for headache. 01/20/19   [provider]  furosemide (LASIX) 20 MG tablet Take 2 tablets twice daily for 5 days, then 2 tablets once daily. 07/01/19   Osvaldo Shipper, MD  lisinopril (ZESTRIL) 20 MG tablet Take 20 mg by mouth daily. 12/23/18   [provider]  METHADONE HCL PO Take 170 mg by mouth daily. Liquid Form     [provider]  oxyCODONE-acetaminophen (PERCOCET) 10-325 MG tablet Take 1 tablet by mouth 4 (four) times daily as needed. 02/07/19   [provider]  potassium chloride 20 MEQ TBCR Take 20 mEq by mouth daily. 07/01/19   Osvaldo Shipper, MD  SUMAtriptan (IMITREX) 100 MG tablet Take 100 mg by mouth daily as needed for migraine. May repeat in 2 hours if headache persists or recurs.    [provider]  verapamil (CALAN-SR) 120 MG CR tablet Take 1 tablet by mouth daily as needed for headache. 05/25/19   [provider]     Allergies    Ibu 600-ezs   Review of Systems   Review of Systems Please see HPI for pertinent positives and negatives  Physical Exam BP (!) 189/90 (BP Location: Left Arm)   Pulse 80    Temp 98.2 F (36.8 C) (Oral)   Resp 16   Ht 6' (1.829 m)   Wt 127 kg   SpO2 90%   BMI 37.97 kg/m   Physical Exam Vitals and nursing note reviewed.  HENT:     Head: Normocephalic.     Nose: Nose normal.  Eyes:     Extraocular Movements: Extraocular movements intact.  Pulmonary:     Effort: Pulmonary effort is normal.  Musculoskeletal:        General: Normal range of motion.     Cervical back: Neck supple.  Skin:    Findings: No rash (on exposed skin).     Comments: Laceration x 2 to LLE, both flap shaped, see photo below.  Neurological:     Mental Status: He is alert and oriented to person, place, and time.  Psychiatric:        Mood and Affect: Mood normal.     ED Results / Procedures / Treatments   EKG None  Procedures .Marland KitchenLaceration Repair  Date/Time: 07/06/2022 2:11 AM  Performed by: Pollyann Savoy, MD Authorized by: Pollyann Savoy, MD   Consent:    Consent obtained:  Verbal   Consent given by:  Patient Anesthesia:    Anesthesia method:  Local infiltration   Local anesthetic:  Lidocaine 2% WITH epi Laceration details:  Location:  Leg   Leg location:  L lower leg   Length (cm):  22 Pre-procedure details:    Preparation:  Patient was prepped and draped in usual sterile fashion Exploration:    Hemostasis achieved with:  Epinephrine and direct pressure Treatment:    Area cleansed with:  Povidone-iodine   Irrigation solution:  Sterile saline   Irrigation method:  Syringe Skin repair:    Repair method:  Sutures   Suture size:  4-0   Suture material:  Nylon   Suture technique:  Simple interrupted   Number of sutures:  19 Approximation:    Approximation:  Close Repair type:    Repair type:  Simple Post-procedure details:    Dressing:  Non-adherent dressing   Procedure completion:  Tolerated well, no immediate complications .Marland KitchenLaceration Repair  Date/Time: 07/06/2022 2:12 AM  Performed by: Pollyann Savoy, MD Authorized by: Pollyann Savoy, MD   Consent:    Consent obtained:  Verbal   Consent given by:  Patient Anesthesia:    Anesthesia method:  Local infiltration   Local anesthetic:  Lidocaine 2% WITH epi Laceration details:    Length (cm):  8 Pre-procedure details:    Preparation:  Patient was prepped and draped in usual sterile fashion Exploration:    Hemostasis achieved with:  Epinephrine and direct pressure Treatment:    Area cleansed with:  Povidone-iodine   Irrigation solution:  Sterile saline   Irrigation method:  Syringe Skin repair:    Repair method:  Sutures   Suture size:  4-0   Suture material:  Nylon   Number of sutures:  8 Approximation:    Approximation:  Close Repair type:    Repair type:  Simple Post-procedure details:    Dressing:  Non-adherent dressing   Procedure completion:  Tolerated well, no immediate complications   Medications Ordered in the ED Medications  lidocaine-EPINEPHrine (XYLOCAINE W/EPI) 2 %-1:200000 (PF) injection 20 mL (20 mLs Infiltration Given 07/06/22 0118)  acetaminophen (TYLENOL) tablet 650 mg (650 mg Oral Given 07/06/22 0204)    Initial Impression and Plan  Wounds repaired as above. Wound care instructions given, patient reports TDAP is UTD. Suture removal in 2 weeks. RTED for any other concerns.   ED Course       MDM Rules/Calculators/A&P Medical Decision Making Problems Addressed: Laceration of left lower extremity, initial encounter: acute illness or injury  Risk OTC drugs. Prescription drug management.     Final Clinical Impression(s) / ED Diagnoses Final diagnoses:  Laceration of left lower extremity, initial encounter    Rx / DC Orders ED Discharge Orders     None        Pollyann Savoy, MD 07/06/22 [phone]

## 2022-07-06 NOTE — ED Notes (Signed)
Patient has received 27 stitches in total to left lower extremity (19) top wound and (8) in bottom wound.

## 2022-07-06 NOTE — ED Triage Notes (Signed)
Patient was at home and hit his leg on a pice of metal that was sticking out of the couch.

## 2023-05-21 ENCOUNTER — Emergency Department (HOSPITAL_COMMUNITY)

## 2023-05-21 ENCOUNTER — Inpatient Hospital Stay (HOSPITAL_COMMUNITY)
Admission: EM | Admit: 2023-05-21 | Discharge: 2023-05-25 | DRG: 208 | Disposition: A | Discharge status: Home Health | Attending: Internal Medicine | Admitting: Internal Medicine

## 2023-05-21 DIAGNOSIS — J9600 Acute respiratory failure, unspecified whether with hypoxia or hypercapnia: Secondary | ICD-10-CM | POA: Diagnosis not present

## 2023-05-21 DIAGNOSIS — J9621 Acute and chronic respiratory failure with hypoxia: Secondary | ICD-10-CM | POA: Diagnosis present

## 2023-05-21 DIAGNOSIS — G43909 Migraine, unspecified, not intractable, without status migrainosus: Secondary | ICD-10-CM | POA: Diagnosis present

## 2023-05-21 DIAGNOSIS — Z6841 Body Mass Index (BMI) 40.0 and over, adult: Secondary | ICD-10-CM

## 2023-05-21 DIAGNOSIS — Z5320 Procedure and treatment not carried out because of patient's decision for unspecified reasons: Secondary | ICD-10-CM | POA: Diagnosis present

## 2023-05-21 DIAGNOSIS — J479 Bronchiectasis, uncomplicated: Secondary | ICD-10-CM | POA: Diagnosis present

## 2023-05-21 DIAGNOSIS — F1121 Opioid dependence, in remission: Secondary | ICD-10-CM | POA: Diagnosis present

## 2023-05-21 DIAGNOSIS — R579 Shock, unspecified: Secondary | ICD-10-CM | POA: Diagnosis not present

## 2023-05-21 DIAGNOSIS — I878 Other specified disorders of veins: Secondary | ICD-10-CM | POA: Diagnosis present

## 2023-05-21 DIAGNOSIS — J9622 Acute and chronic respiratory failure with hypercapnia: Secondary | ICD-10-CM | POA: Diagnosis present

## 2023-05-21 DIAGNOSIS — E66813 Obesity, class 3: Secondary | ICD-10-CM | POA: Diagnosis present

## 2023-05-21 DIAGNOSIS — G4700 Insomnia, unspecified: Secondary | ICD-10-CM | POA: Diagnosis present

## 2023-05-21 DIAGNOSIS — J441 Chronic obstructive pulmonary disease with (acute) exacerbation: Secondary | ICD-10-CM | POA: Diagnosis not present

## 2023-05-21 DIAGNOSIS — Z79899 Other long term (current) drug therapy: Secondary | ICD-10-CM

## 2023-05-21 DIAGNOSIS — J9601 Acute respiratory failure with hypoxia: Principal | ICD-10-CM

## 2023-05-21 DIAGNOSIS — Z9049 Acquired absence of other specified parts of digestive tract: Secondary | ICD-10-CM

## 2023-05-21 DIAGNOSIS — I890 Lymphedema, not elsewhere classified: Secondary | ICD-10-CM | POA: Diagnosis present

## 2023-05-21 DIAGNOSIS — S81812A Laceration without foreign body, left lower leg, initial encounter: Secondary | ICD-10-CM | POA: Diagnosis present

## 2023-05-21 DIAGNOSIS — K219 Gastro-esophageal reflux disease without esophagitis: Secondary | ICD-10-CM | POA: Diagnosis present

## 2023-05-21 DIAGNOSIS — D638 Anemia in other chronic diseases classified elsewhere: Secondary | ICD-10-CM | POA: Diagnosis present

## 2023-05-21 DIAGNOSIS — Z886 Allergy status to analgesic agent status: Secondary | ICD-10-CM

## 2023-05-21 DIAGNOSIS — G9341 Metabolic encephalopathy: Secondary | ICD-10-CM | POA: Diagnosis present

## 2023-05-21 DIAGNOSIS — E875 Hyperkalemia: Secondary | ICD-10-CM | POA: Diagnosis present

## 2023-05-21 DIAGNOSIS — R739 Hyperglycemia, unspecified: Secondary | ICD-10-CM | POA: Diagnosis not present

## 2023-05-21 DIAGNOSIS — F17290 Nicotine dependence, other tobacco product, uncomplicated: Secondary | ICD-10-CM | POA: Diagnosis present

## 2023-05-21 DIAGNOSIS — I10 Essential (primary) hypertension: Secondary | ICD-10-CM | POA: Diagnosis present

## 2023-05-21 LAB — I-STAT VENOUS BLOOD GAS, ED
Acid-base deficit: 2 mmol/L (ref 0.0–2.0)
Acid-base deficit: 3 mmol/L — ABNORMAL HIGH (ref 0.0–2.0)
Bicarbonate: 26.7 mmol/L (ref 20.0–28.0)
Bicarbonate: 26.1 mmol/L (ref 20.0–28.0)
Calcium, Ion: 1.09 mmol/L — ABNORMAL LOW (ref 1.15–1.40)
Calcium, Ion: 1.07 mmol/L — ABNORMAL LOW (ref 1.15–1.40)
HCT: 31 % — ABNORMAL LOW (ref 39.0–52.0)
HCT: 33 % — ABNORMAL LOW (ref 39.0–52.0)
Hemoglobin: 10.5 g/dL — ABNORMAL LOW (ref 13.0–17.0)
Hemoglobin: 11.2 g/dL — ABNORMAL LOW (ref 13.0–17.0)
O2 Saturation: 86 %
O2 Saturation: 97 %
Potassium: 5 mmol/L (ref 3.5–5.1)
Potassium: 5.3 mmol/L — ABNORMAL HIGH (ref 3.5–5.1)
Sodium: 134 mmol/L — ABNORMAL LOW (ref 135–145)
Sodium: 135 mmol/L (ref 135–145)
TCO2: 29 mmol/L (ref 22–32)
TCO2: 28 mmol/L (ref 22–32)
pCO2, Ven: 63.1 mmHg — ABNORMAL HIGH (ref 44–60)
pCO2, Ven: 66.6 mmHg — ABNORMAL HIGH (ref 44–60)
pH, Ven: 7.235 — ABNORMAL LOW (ref 7.25–7.43)
pH, Ven: 7.201 — ABNORMAL LOW (ref 7.25–7.43)
pO2, Ven: 63 mmHg — ABNORMAL HIGH (ref 32–45)
pO2, Ven: 120 mmHg — ABNORMAL HIGH (ref 32–45)

## 2023-05-21 LAB — CBC WITH DIFFERENTIAL/PLATELET
Abs Immature Granulocytes: 0.05 10*3/uL (ref 0.00–0.07)
Basophils Absolute: 0.1 10*3/uL (ref 0.0–0.1)
Basophils Relative: 1 %
Eosinophils Absolute: 0.2 10*3/uL (ref 0.0–0.5)
Eosinophils Relative: 2 %
HCT: 32.2 % — ABNORMAL LOW (ref 39.0–52.0)
Hemoglobin: 9.6 g/dL — ABNORMAL LOW (ref 13.0–17.0)
Immature Granulocytes: 1 %
Lymphocytes Relative: 12 %
Lymphs Abs: 1.3 10*3/uL (ref 0.7–4.0)
MCH: 26.8 pg (ref 26.0–34.0)
MCHC: 29.8 g/dL — ABNORMAL LOW (ref 30.0–36.0)
MCV: 89.9 fL (ref 80.0–100.0)
Monocytes Absolute: 0.7 10*3/uL (ref 0.1–1.0)
Monocytes Relative: 6 %
Neutro Abs: 8.4 10*3/uL — ABNORMAL HIGH (ref 1.7–7.7)
Neutrophils Relative %: 78 %
Platelets: 290 10*3/uL (ref 150–400)
RBC: 3.58 MIL/uL — ABNORMAL LOW (ref 4.22–5.81)
RDW: 17.6 % — ABNORMAL HIGH (ref 11.5–15.5)
WBC: 10.7 10*3/uL — ABNORMAL HIGH (ref 4.0–10.5)
nRBC: 0 % (ref 0.0–0.2)

## 2023-05-21 LAB — BASIC METABOLIC PANEL WITH GFR
Anion gap: 9 (ref 5–15)
BUN: 39 mg/dL — ABNORMAL HIGH (ref 6–20)
CO2: 24 mmol/L (ref 22–32)
Calcium: 8.2 mg/dL — ABNORMAL LOW (ref 8.9–10.3)
Chloride: 102 mmol/L (ref 98–111)
Creatinine, Ser: 1.23 mg/dL (ref 0.61–1.24)
GFR, Estimated: >60 mL/min (ref >60)
Glucose, Bld: 126 mg/dL — ABNORMAL HIGH (ref 70–99)
Potassium: 4.8 mmol/L (ref 3.5–5.1)
Sodium: 135 mmol/L (ref 135–145)

## 2023-05-21 LAB — TROPONIN I (HIGH SENSITIVITY)
Troponin I (High Sensitivity): 11 ng/L (ref <18)
Troponin I (High Sensitivity): 8 ng/L (ref <18)

## 2023-05-21 LAB — BRAIN NATRIURETIC PEPTIDE: B Natriuretic Peptide: 116.6 pg/mL — ABNORMAL HIGH (ref 0.0–100.0)

## 2023-05-21 LAB — PROTIME-INR
INR: 1.1 (ref 0.8–1.2)
Prothrombin Time: 14.1 s (ref 11.4–15.2)

## 2023-05-21 MED ORDER — IOHEXOL 350 MG/ML SOLN
75.0000 mL | Freq: Once | INTRAVENOUS | Status: AC | PRN
  Administered 2023-05-21: 75 mL via INTRAVENOUS

## 2023-05-21 MED ORDER — ONDANSETRON HCL 4 MG/2ML IJ SOLN
4.0000 mg | Freq: Once | INTRAMUSCULAR | Status: AC
  Administered 2023-05-21: 4 mg via INTRAVENOUS
  Filled 2023-05-21: qty 2

## 2023-05-21 MED ORDER — IPRATROPIUM-ALBUTEROL 0.5-2.5 (3) MG/3ML IN SOLN
3.0000 mL | Freq: Once | RESPIRATORY_TRACT | Status: AC
  Administered 2023-05-21: 3 mL via RESPIRATORY_TRACT
  Filled 2023-05-21: qty 3

## 2023-05-21 MED ORDER — LIDOCAINE-EPINEPHRINE 1 %-1:100000 IJ SOLN
20.0000 mL | Freq: Once | INTRAMUSCULAR | Status: AC
Start: 2023-05-21 — End: 2023-05-21
  Administered 2023-05-21: 20 mL
  Filled 2023-05-21: qty 1

## 2023-05-21 MED ORDER — METHYLPREDNISOLONE SODIUM SUCC 125 MG IJ SOLR
125.0000 mg | Freq: Once | INTRAMUSCULAR | Status: AC
  Administered 2023-05-21: 125 mg via INTRAVENOUS
  Filled 2023-05-21: qty 2

## 2023-05-21 MED ORDER — SODIUM CHLORIDE 0.9 % IV BOLUS
1000.0000 mL | Freq: Once | INTRAVENOUS | Status: AC
  Administered 2023-05-21: 1000 mL via INTRAVENOUS

## 2023-05-21 MED ORDER — MORPHINE SULFATE (PF) 4 MG/ML IV SOLN
4.0000 mg | Freq: Once | INTRAVENOUS | Status: AC
  Administered 2023-05-21: 4 mg via INTRAVENOUS
  Filled 2023-05-21: qty 1

## 2023-05-21 MED ORDER — SODIUM CHLORIDE 0.9 % IV SOLN: 2.0000 g | Freq: Once | INTRAVENOUS | Status: DC

## 2023-05-21 MED ORDER — LIDOCAINE-EPINEPHRINE-TETRACAINE (LET) TOPICAL GEL
3.0000 mL | Freq: Once | TOPICAL | Status: AC
  Administered 2023-05-21: 3 mL via TOPICAL
  Filled 2023-05-21: qty 3
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
## 2023-05-21 NOTE — ED Triage Notes (Signed)
 Pt arrives via [facility] from home where he was remodeling his bathroom and injured his L calf on a broken countertop. EBL 300 mL. Pt not on blood thinners. Last tetanus 1 year ago.   BP 100/64, HR 101, 96% on RA.

## 2023-05-21 NOTE — ED Notes (Signed)
 CCMD called and verified patient on cardiac telemetry.

## 2023-05-21 NOTE — ED Notes (Signed)
 Patient was diaphoretic, labored breathing, and oxygen saturation in 70's and difficult to arouse. EDP and RT at bedside.

## 2023-05-22 ENCOUNTER — Encounter (HOSPITAL_COMMUNITY): Payer: Self-pay | Admitting: Family Medicine

## 2023-05-22 ENCOUNTER — Inpatient Hospital Stay (HOSPITAL_COMMUNITY)

## 2023-05-22 DIAGNOSIS — I10 Essential (primary) hypertension: Secondary | ICD-10-CM | POA: Diagnosis present

## 2023-05-22 DIAGNOSIS — Z6841 Body Mass Index (BMI) 40.0 and over, adult: Secondary | ICD-10-CM | POA: Diagnosis not present

## 2023-05-22 DIAGNOSIS — S81812D Laceration without foreign body, left lower leg, subsequent encounter: Secondary | ICD-10-CM | POA: Diagnosis not present

## 2023-05-22 DIAGNOSIS — R0603 Acute respiratory distress: Secondary | ICD-10-CM

## 2023-05-22 DIAGNOSIS — J9601 Acute respiratory failure with hypoxia: Secondary | ICD-10-CM | POA: Diagnosis not present

## 2023-05-22 DIAGNOSIS — F17290 Nicotine dependence, other tobacco product, uncomplicated: Secondary | ICD-10-CM | POA: Diagnosis present

## 2023-05-22 DIAGNOSIS — S81812A Laceration without foreign body, left lower leg, initial encounter: Secondary | ICD-10-CM | POA: Diagnosis present

## 2023-05-22 DIAGNOSIS — I878 Other specified disorders of veins: Secondary | ICD-10-CM | POA: Diagnosis present

## 2023-05-22 DIAGNOSIS — E66813 Obesity, class 3: Secondary | ICD-10-CM | POA: Diagnosis present

## 2023-05-22 DIAGNOSIS — R579 Shock, unspecified: Secondary | ICD-10-CM | POA: Diagnosis not present

## 2023-05-22 DIAGNOSIS — Z9049 Acquired absence of other specified parts of digestive tract: Secondary | ICD-10-CM | POA: Diagnosis not present

## 2023-05-22 DIAGNOSIS — J9600 Acute respiratory failure, unspecified whether with hypoxia or hypercapnia: Secondary | ICD-10-CM | POA: Diagnosis present

## 2023-05-22 DIAGNOSIS — J479 Bronchiectasis, uncomplicated: Secondary | ICD-10-CM | POA: Diagnosis present

## 2023-05-22 DIAGNOSIS — G43909 Migraine, unspecified, not intractable, without status migrainosus: Secondary | ICD-10-CM | POA: Diagnosis present

## 2023-05-22 DIAGNOSIS — J9622 Acute and chronic respiratory failure with hypercapnia: Secondary | ICD-10-CM

## 2023-05-22 DIAGNOSIS — Z886 Allergy status to analgesic agent status: Secondary | ICD-10-CM | POA: Diagnosis not present

## 2023-05-22 DIAGNOSIS — J9621 Acute and chronic respiratory failure with hypoxia: Secondary | ICD-10-CM | POA: Diagnosis present

## 2023-05-22 DIAGNOSIS — J441 Chronic obstructive pulmonary disease with (acute) exacerbation: Secondary | ICD-10-CM | POA: Diagnosis present

## 2023-05-22 DIAGNOSIS — D638 Anemia in other chronic diseases classified elsewhere: Secondary | ICD-10-CM | POA: Diagnosis present

## 2023-05-22 DIAGNOSIS — F1121 Opioid dependence, in remission: Secondary | ICD-10-CM | POA: Diagnosis present

## 2023-05-22 DIAGNOSIS — J9602 Acute respiratory failure with hypercapnia: Secondary | ICD-10-CM | POA: Diagnosis not present

## 2023-05-22 DIAGNOSIS — I890 Lymphedema, not elsewhere classified: Secondary | ICD-10-CM | POA: Diagnosis present

## 2023-05-22 DIAGNOSIS — E875 Hyperkalemia: Secondary | ICD-10-CM | POA: Diagnosis present

## 2023-05-22 DIAGNOSIS — Z79899 Other long term (current) drug therapy: Secondary | ICD-10-CM | POA: Diagnosis not present

## 2023-05-22 DIAGNOSIS — R739 Hyperglycemia, unspecified: Secondary | ICD-10-CM | POA: Diagnosis not present

## 2023-05-22 DIAGNOSIS — Z5320 Procedure and treatment not carried out because of patient's decision for unspecified reasons: Secondary | ICD-10-CM | POA: Diagnosis present

## 2023-05-22 DIAGNOSIS — G4700 Insomnia, unspecified: Secondary | ICD-10-CM | POA: Diagnosis present

## 2023-05-22 DIAGNOSIS — G9341 Metabolic encephalopathy: Secondary | ICD-10-CM | POA: Diagnosis present

## 2023-05-22 DIAGNOSIS — K219 Gastro-esophageal reflux disease without esophagitis: Secondary | ICD-10-CM | POA: Diagnosis present

## 2023-05-22 LAB — ECHOCARDIOGRAM COMPLETE
Area-P 1/2: 3.31 cm2
Height: 72 in
S' Lateral: 4 cm
Weight: 5104.09 [oz_av]

## 2023-05-22 LAB — CBC
HCT: 30.8 % — ABNORMAL LOW (ref 39.0–52.0)
Hemoglobin: 9.5 g/dL — ABNORMAL LOW (ref 13.0–17.0)
MCH: 27.1 pg (ref 26.0–34.0)
MCHC: 30.8 g/dL (ref 30.0–36.0)
MCV: 87.7 fL (ref 80.0–100.0)
Platelets: 239 10*3/uL (ref 150–400)
RBC: 3.51 MIL/uL — ABNORMAL LOW (ref 4.22–5.81)
RDW: 17.7 % — ABNORMAL HIGH (ref 11.5–15.5)
WBC: 6.7 10*3/uL (ref 4.0–10.5)
nRBC: 0 % (ref 0.0–0.2)

## 2023-05-22 LAB — IRON AND TIBC
Iron: 89 ug/dL (ref 45–182)
Saturation Ratios: 25 % (ref 17.9–39.5)
TIBC: 360 ug/dL (ref 250–450)
UIBC: 271 ug/dL

## 2023-05-22 LAB — GLUCOSE, CAPILLARY
Glucose-Capillary: 123 mg/dL — ABNORMAL HIGH (ref 70–99)
Glucose-Capillary: 135 mg/dL — ABNORMAL HIGH (ref 70–99)
Glucose-Capillary: 156 mg/dL — ABNORMAL HIGH (ref 70–99)
Glucose-Capillary: 160 mg/dL — ABNORMAL HIGH (ref 70–99)
Glucose-Capillary: 163 mg/dL — ABNORMAL HIGH (ref 70–99)
Glucose-Capillary: 195 mg/dL — ABNORMAL HIGH (ref 70–99)

## 2023-05-22 LAB — RESPIRATORY PANEL BY PCR

## 2023-05-22 LAB — BLOOD GAS, ARTERIAL
Acid-base deficit: 3.3 mmol/L — ABNORMAL HIGH (ref 0.0–2.0)
Bicarbonate: 24.3 mmol/L (ref 20.0–28.0)
O2 Saturation: 98.8 %
Patient temperature: 37
pCO2 arterial: 53 mmHg — ABNORMAL HIGH (ref 32–48)
pH, Arterial: 7.27 — ABNORMAL LOW (ref 7.35–7.45)
pO2, Arterial: 253 mmHg — ABNORMAL HIGH (ref 83–108)

## 2023-05-22 LAB — PROCALCITONIN: Procalcitonin: 0.17 ng/mL

## 2023-05-22 LAB — FERRITIN: Ferritin: 86 ng/mL (ref 24–336)

## 2023-05-22 LAB — BASIC METABOLIC PANEL WITH GFR
Anion gap: 8 (ref 5–15)
BUN: 32 mg/dL — ABNORMAL HIGH (ref 6–20)
CO2: 24 mmol/L (ref 22–32)
Calcium: 8.3 mg/dL — ABNORMAL LOW (ref 8.9–10.3)
Chloride: 103 mmol/L (ref 98–111)
Creatinine, Ser: 0.9 mg/dL (ref 0.61–1.24)
GFR, Estimated: >60 mL/min (ref >60)
Glucose, Bld: 160 mg/dL — ABNORMAL HIGH (ref 70–99)
Potassium: 5.1 mmol/L (ref 3.5–5.1)
Sodium: 135 mmol/L (ref 135–145)

## 2023-05-22 LAB — HEMOGLOBIN A1C
Hgb A1c MFr Bld: 5.5 % (ref 4.8–5.6)
Mean Plasma Glucose: 111.15 mg/dL

## 2023-05-22 LAB — RESP PANEL BY RT-PCR (RSV, FLU A&B, COVID)  RVPGX2
Influenza A by PCR: NEGATIVE
Influenza B by PCR: NEGATIVE
Resp Syncytial Virus by PCR: NEGATIVE
SARS Coronavirus 2 by RT PCR: NEGATIVE

## 2023-05-22 LAB — HIV ANTIBODY (ROUTINE TESTING W REFLEX): HIV Screen 4th Generation wRfx: NONREACTIVE

## 2023-05-22 MED ORDER — IPRATROPIUM-ALBUTEROL 0.5-2.5 (3) MG/3ML IN SOLN
3.0000 mL | RESPIRATORY_TRACT | Status: DC | PRN
  Filled 2023-05-22: qty 3

## 2023-05-22 MED ORDER — JUVEN PO PACK
1.0000 | PACK | Freq: Two times a day (BID) | ORAL | Status: DC
  Administered 2023-05-22 – 2023-05-24 (×2): 1
  Filled 2023-05-22 (×2): qty 1

## 2023-05-22 MED ORDER — CHLORHEXIDINE GLUCONATE CLOTH 2 % EX PADS
6.0000 | MEDICATED_PAD | Freq: Every day | CUTANEOUS | Status: DC
  Administered 2023-05-22: 6 via TOPICAL

## 2023-05-22 MED ORDER — PANTOPRAZOLE SODIUM 40 MG IV SOLR: 40.0000 mg | Freq: Every day | INTRAVENOUS | Status: DC

## 2023-05-22 MED ORDER — FAMOTIDINE 20 MG PO TABS
20.0000 mg | ORAL_TABLET | Freq: Two times a day (BID) | ORAL | Status: DC
  Administered 2023-05-22 – 2023-05-23 (×3): 20 mg
  Filled 2023-05-22 (×3): qty 1

## 2023-05-22 MED ORDER — ROCURONIUM BROMIDE 10 MG/ML (PF) SYRINGE
PREFILLED_SYRINGE | INTRAVENOUS | Status: AC | PRN
  Administered 2023-05-22: 100 mg via INTRAVENOUS

## 2023-05-22 MED ORDER — FENTANYL CITRATE PF 50 MCG/ML IJ SOSY: 50.0000 ug | PREFILLED_SYRINGE | Freq: Once | INTRAMUSCULAR | Status: DC

## 2023-05-22 MED ORDER — INSULIN ASPART 100 UNIT/ML IJ SOLN
0.0000 [IU] | INTRAMUSCULAR | Status: DC
  Administered 2023-05-22: 2 [IU] via SUBCUTANEOUS
  Administered 2023-05-22 (×2): 3 [IU] via SUBCUTANEOUS
  Administered 2023-05-22 (×3): 2 [IU] via SUBCUTANEOUS
  Administered 2023-05-23: 8 [IU] via SUBCUTANEOUS
  Administered 2023-05-23: 2 [IU] via SUBCUTANEOUS
  Administered 2023-05-23: 5 [IU] via SUBCUTANEOUS
  Administered 2023-05-23 (×2): 2 [IU] via SUBCUTANEOUS
  Administered 2023-05-24 – 2023-05-25 (×4): 3 [IU] via SUBCUTANEOUS
  Administered 2023-05-25: 2 [IU] via SUBCUTANEOUS

## 2023-05-22 MED ORDER — PROCHLORPERAZINE EDISYLATE 10 MG/2ML IJ SOLN: 5.0000 mg | Freq: Four times a day (QID) | INTRAMUSCULAR | Status: DC | PRN

## 2023-05-22 MED ORDER — FENTANYL 2500MCG IN NS 250ML (10MCG/ML) PREMIX INFUSION
0.0000 ug/h | INTRAVENOUS | Status: DC
  Administered 2023-05-22: 200 ug/h via INTRAVENOUS
  Administered 2023-05-22: 50 ug/h via INTRAVENOUS
  Administered 2023-05-22: 75 ug/h via INTRAVENOUS
  Administered 2023-05-23: 250 ug/h via INTRAVENOUS
  Filled 2023-05-22 (×3): qty 250

## 2023-05-22 MED ORDER — REVEFENACIN 175 MCG/3ML IN SOLN
175.0000 ug | Freq: Every day | RESPIRATORY_TRACT | Status: DC
  Administered 2023-05-22 – 2023-05-25 (×4): 175 ug via RESPIRATORY_TRACT
  Filled 2023-05-22 (×4): qty 3

## 2023-05-22 MED ORDER — ORAL CARE MOUTH RINSE
15.0000 mL | OROMUCOSAL | Status: DC
  Administered 2023-05-22 – 2023-05-23 (×17): 15 mL via OROMUCOSAL

## 2023-05-22 MED ORDER — ADULT MULTIVITAMIN W/MINERALS CH
1.0000 | ORAL_TABLET | Freq: Every day | ORAL | Status: DC
  Administered 2023-05-22 – 2023-05-23 (×2): 1
  Filled 2023-05-22 (×2): qty 1

## 2023-05-22 MED ORDER — OXYCODONE HCL 5 MG PO TABS: 5.0000 mg | ORAL_TABLET | ORAL | Status: DC | PRN

## 2023-05-22 MED ORDER — PREDNISONE 20 MG PO TABS
40.0000 mg | ORAL_TABLET | Freq: Every day | ORAL | Status: DC
  Administered 2023-05-23 – 2023-05-25 (×3): 40 mg via ORAL
  Filled 2023-05-22 (×3): qty 2

## 2023-05-22 MED ORDER — DOCUSATE SODIUM 50 MG/5ML PO LIQD
100.0000 mg | Freq: Two times a day (BID) | ORAL | Status: DC
  Administered 2023-05-22 (×2): 100 mg
  Filled 2023-05-22 (×2): qty 10

## 2023-05-22 MED ORDER — METHYLPREDNISOLONE SODIUM SUCC 125 MG IJ SOLR
125.0000 mg | Freq: Two times a day (BID) | INTRAMUSCULAR | Status: AC
  Administered 2023-05-22 (×2): 125 mg via INTRAVENOUS
  Filled 2023-05-22 (×2): qty 2

## 2023-05-22 MED ORDER — PROSOURCE TF20 ENFIT COMPATIBL EN LIQD
60.0000 mL | Freq: Every day | ENTERAL | Status: DC
Start: 2023-05-22 — End: 2023-05-24
  Administered 2023-05-22 – 2023-05-24 (×2): 60 mL
  Filled 2023-05-22 (×2): qty 60

## 2023-05-22 MED ORDER — ARFORMOTEROL TARTRATE 15 MCG/2ML IN NEBU
15.0000 ug | INHALATION_SOLUTION | Freq: Two times a day (BID) | RESPIRATORY_TRACT | Status: DC
  Administered 2023-05-22 – 2023-05-25 (×6): 15 ug via RESPIRATORY_TRACT
  Filled 2023-05-22 (×6): qty 2

## 2023-05-22 MED ORDER — HYDROMORPHONE HCL 1 MG/ML IJ SOLN
1.0000 mg | INTRAMUSCULAR | Status: DC | PRN
  Administered 2023-05-22: 1 mg via INTRAVENOUS
  Filled 2023-05-22: qty 1

## 2023-05-22 MED ORDER — ACETAMINOPHEN 650 MG RE SUPP: 650.0000 mg | Freq: Four times a day (QID) | RECTAL | Status: DC | PRN

## 2023-05-22 MED ORDER — SODIUM CHLORIDE 0.9% FLUSH
3.0000 mL | Freq: Two times a day (BID) | INTRAVENOUS | Status: DC
  Administered 2023-05-22 – 2023-05-25 (×7): 3 mL via INTRAVENOUS

## 2023-05-22 MED ORDER — POLYETHYLENE GLYCOL 3350 17 G PO PACK: 17.0000 g | PACK | Freq: Every day | ORAL | Status: DC | PRN

## 2023-05-22 MED ORDER — POLYETHYLENE GLYCOL 3350 17 G PO PACK
17.0000 g | PACK | Freq: Every day | ORAL | Status: DC
  Administered 2023-05-22: 17 g
  Filled 2023-05-22: qty 1

## 2023-05-22 MED ORDER — BUDESONIDE 0.25 MG/2ML IN SUSP
0.2500 mg | Freq: Two times a day (BID) | RESPIRATORY_TRACT | Status: DC
  Administered 2023-05-22 – 2023-05-25 (×7): 0.25 mg via RESPIRATORY_TRACT
  Filled 2023-05-22 (×7): qty 2

## 2023-05-22 MED ORDER — SODIUM CHLORIDE 0.9 % IV SOLN
1.0000 g | INTRAVENOUS | Status: DC
  Administered 2023-05-22: 1 g via INTRAVENOUS
  Filled 2023-05-22 (×2): qty 10

## 2023-05-22 MED ORDER — ORAL CARE MOUTH RINSE: 15.0000 mL | OROMUCOSAL | Status: DC | PRN

## 2023-05-22 MED ORDER — LORAZEPAM 1 MG PO TABS
2.0000 mg | ORAL_TABLET | Freq: Once | ORAL | Status: AC
  Administered 2023-05-22: 2 mg via ORAL
  Filled 2023-05-22: qty 2

## 2023-05-22 MED ORDER — PERFLUTREN LIPID MICROSPHERE
1.0000 mL | INTRAVENOUS | Status: AC | PRN
  Administered 2023-05-22: 3 mL via INTRAVENOUS

## 2023-05-22 MED ORDER — SODIUM CHLORIDE 0.9 % IV SOLN: 1.0000 g | INTRAVENOUS | Status: DC

## 2023-05-22 MED ORDER — FUROSEMIDE 10 MG/ML IJ SOLN
40.0000 mg | Freq: Once | INTRAMUSCULAR | Status: AC
  Administered 2023-05-22: 40 mg via INTRAVENOUS
  Filled 2023-05-22: qty 4

## 2023-05-22 MED ORDER — ETOMIDATE 2 MG/ML IV SOLN
INTRAVENOUS | Status: AC | PRN
  Administered 2023-05-22: 20 mg via INTRAVENOUS

## 2023-05-22 MED ORDER — PROPOFOL 1000 MG/100ML IV EMUL
5.0000 ug/kg/min | INTRAVENOUS | Status: DC
  Administered 2023-05-22: 60 ug/kg/min via INTRAVENOUS
  Administered 2023-05-22: 15 ug/kg/min via INTRAVENOUS
  Administered 2023-05-22: 40 ug/kg/min via INTRAVENOUS
  Administered 2023-05-22 (×2): 25 ug/kg/min via INTRAVENOUS
  Administered 2023-05-22 – 2023-05-23 (×4): 55 ug/kg/min via INTRAVENOUS
  Administered 2023-05-23: 60 ug/kg/min via INTRAVENOUS
  Filled 2023-05-22 (×11): qty 100

## 2023-05-22 MED ORDER — OSMOLITE 1.5 CAL PO LIQD
1000.0000 mL | ORAL | Status: DC
  Administered 2023-05-22: 1000 mL
  Filled 2023-05-22 (×3): qty 1000

## 2023-05-22 MED ORDER — IPRATROPIUM-ALBUTEROL 0.5-2.5 (3) MG/3ML IN SOLN
3.0000 mL | Freq: Four times a day (QID) | RESPIRATORY_TRACT | Status: DC
  Administered 2023-05-22 – 2023-05-23 (×7): 3 mL via RESPIRATORY_TRACT
  Filled 2023-05-22 (×7): qty 3

## 2023-05-22 MED ORDER — ACETAMINOPHEN 325 MG PO TABS
650.0000 mg | ORAL_TABLET | Freq: Four times a day (QID) | ORAL | Status: DC | PRN
  Administered 2023-05-23 – 2023-05-24 (×4): 650 mg via ORAL
  Filled 2023-05-22 (×4): qty 2

## 2023-05-22 MED ORDER — FENTANYL BOLUS VIA INFUSION
50.0000 ug | INTRAVENOUS | Status: DC | PRN
  Administered 2023-05-22: 100 ug via INTRAVENOUS
  Administered 2023-05-22: 75 ug via INTRAVENOUS
  Administered 2023-05-22 – 2023-05-23 (×8): 100 ug via INTRAVENOUS
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
## 2023-05-22 NOTE — ED Notes (Addendum)
 Pt SpO2 <60% at this time. MD notified of situation. No new orders at this time.

## 2023-05-22 NOTE — ED Provider Notes (Signed)
 Called to bedside for unresponsiveness.  Patient admitted for COPD exacerbation.  Had been refusing Bipap.  Now suspect severe hypercarbia.  Minimal response to sternal rub.  Seemingly failing HFNC.  Not enough mental statu sfor bipap currently and would likely rip it off if Co2 improved.  Decision made to intubate as described below.  ICU to admit.  .Critical Care  Performed by: Edson Graces, MD Authorized by: Edson Graces, MD   Critical care provider statement:    Critical care time (minutes):  35   Critical care was necessary to treat or prevent imminent or life-threatening deterioration of the following conditions:  Respiratory failure   Critical care was time spent personally by me on the following activities:  Development of treatment plan with patient or surrogate, discussions with consultants, evaluation of patient's response to treatment, examination of patient, ordering and review of laboratory studies, ordering and review of radiographic studies, ordering and performing treatments and interventions, pulse oximetry, re-evaluation of patient's condition and review of old charts Procedure Name: Intubation Date/Time: 05/22/2023 2:59 AM  Performed by: Edson Graces, MDPre-anesthesia Checklist: Patient identified, Patient being monitored, Emergency Drugs available, Timeout performed and Suction available Oxygen Delivery Method: Non-rebreather mask Preoxygenation: Pre-oxygenation with 100% oxygen Induction Type: Rapid sequence Ventilation: Mask ventilation without difficulty Laryngoscope Size: Glidescope and 4 Grade View: Grade I Tube size: 7.5 mm Number of attempts: 2 Airway Equipment and Method: Rigid stylet Placement Confirmation: ETT inserted through vocal cords under direct vision, CO2 detector and Breath sounds checked- equal and bilateral Secured at: 24 cm Tube secured with: ETT holder Difficulty Due To: Difficulty was anticipated Comments: Difficulty somewhat anticipated  due to body habitus.  Initial attempt with grade 1 view but very small opening of the vocal cords.  With small repositioning second attempt was successful.  20mg  etomidate , 100mg  rocuronium .          Edson Graces, MD 05/22/23 0301

## 2023-05-22 NOTE — ED Notes (Signed)
 Pt very difficult to arouse at this time. MD notified and on way to see patient.

## 2023-05-22 NOTE — TOC Initial Note (Signed)
 Transition of Care [facility]) - Initial/Assessment Note    Patient Details  Name: Nathaniel [name] MRN: [id number] Date of Birth: [birth year]  Transition of Care [facility]) CM/SW Contact:    Lauraine FORBES Saa, LCSW Phone Number: 05/22/2023, 12:43 PM  Clinical Narrative:                  12:43 PM CSW introduced self and role to patient's aunt, Erminio, and second cousin at bedside (patient currently intubated). Aunt confirmed patient resides with adult son in a mobile home. Aunt confirmed she could provide transportation sometimes upon discharge but not home support and could not identify other family members to assist. Aunt declined patient's history of HH/DME/SNF but stated he receives and aide through insurance.    Barriers to Discharge: Continued Medical Work up   Patient Goals and CMS Choice            Expected Discharge Plan and Services       Living arrangements for the past 2 months: Mobile Home                                      Prior Living Arrangements/Services Living arrangements for the past 2 months: Mobile Home Lives with:: Adult Children Patient language and need for interpreter reviewed:: Yes        Need for Family Participation in Patient Care: Yes (Comment) Care giver support system in place?: No (comment)   Criminal Activity/Legal Involvement Pertinent to Current Situation/Hospitalization: No - Comment as needed  Activities of Daily Living      Permission Sought/Granted Permission sought to share information with : Family Supports Permission granted to share information with : No (Contact information on chart)  Share Information with NAME: Brenda Peeden     Permission granted to share info w Relationship: Aunt  Permission granted to share info w Contact Information: [phone]  Emotional Assessment Appearance:: Appears stated age Attitude/Demeanor/Rapport: Unable to Assess, Intubated (Following Commands or Not Following Commands) Affect  (typically observed): Unable to Assess   Alcohol / Substance Use: Not Applicable Psych Involvement: No (comment)  Admission diagnosis:  Acute respiratory failure (HCC) [J96.00] COPD exacerbation (HCC) [J44.1] COPD with acute exacerbation (HCC) [J44.1] Laceration of left lower extremity, initial encounter [S81.812A] Acute respiratory failure with hypoxia and hypercapnia (HCC) [J96.01, J96.02] Patient Active Problem List   Diagnosis Date Noted   COPD with acute exacerbation (HCC) 05/22/2023   Acute on chronic respiratory failure with hypoxia and hypercapnia (HCC) 05/22/2023   Laceration of left leg excluding thigh 05/22/2023   Acute respiratory failure (HCC) 05/22/2023   Tobacco dependence    Heroin use disorder, moderate, in sustained remission, dependence (HCC)    COPD (chronic obstructive pulmonary disease) (HCC)    Lymphedema of both lower extremities    Morbid obesity with BMI of 40.0-44.9, adult (HCC)    Acute respiratory failure with hypoxia and hypercapnia (HCC) 06/28/2019   PCP:  Pcp, No Pharmacy:   [facility] [phone] - [place], [place] - [address] [place] [address] Phone: [phone] Fax: [phone]     Social Drivers of Health (SDOH) Social History: SDOH Screenings   Social Connections: Unknown (06/14/2021)   Received from [facility]  Tobacco Use: Low Risk  (05/22/2023)   SDOH Interventions:     Readmission Risk Interventions     No data to display
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
## 2023-05-22 NOTE — Plan of Care (Signed)

## 2023-05-22 NOTE — Progress Notes (Signed)
  Echocardiogram 2D Echocardiogram has been performed.  Nathaniel [name] 05/22/2023, 6:02 PM

## 2023-05-22 NOTE — Plan of Care (Signed)
  Problem: Education: Goal: Ability to describe self-care measures that may prevent or decrease complications (Diabetes Survival Skills Education) will improve Outcome: Not Progressing Goal: Individualized Educational Video(s) Outcome: Not Progressing   Problem: Coping: Goal: Ability to adjust to condition or change in health will improve Outcome: Not Progressing   Problem: Fluid Volume: Goal: Ability to maintain a balanced intake and output will improve Outcome: Not Progressing   Problem: Health Behavior/Discharge Planning: Goal: Ability to identify and utilize available resources and services will improve Outcome: Not Progressing Goal: Ability to manage health-related needs will improve Outcome: Not Progressing   Problem: Metabolic: Goal: Ability to maintain appropriate glucose levels will improve Outcome: Not Progressing   Problem: Nutritional: Goal: Maintenance of adequate nutrition will improve Outcome: Not Progressing Goal: Progress toward achieving an optimal weight will improve Outcome: Not Progressing   Problem: Skin Integrity: Goal: Risk for impaired skin integrity will decrease Outcome: Not Progressing   Problem: Tissue Perfusion: Goal: Adequacy of tissue perfusion will improve Outcome: Not Progressing   Problem: Activity: Goal: Ability to tolerate increased activity will improve Outcome: Not Progressing   Problem: Respiratory: Goal: Ability to maintain a clear airway and adequate ventilation will improve Outcome: Not Progressing   Problem: Role Relationship: Goal: Method of communication will improve Outcome: Not Progressing   Problem: Education: Goal: Knowledge of General Education information will improve Description: Including pain rating scale, medication(s)/side effects and non-pharmacologic comfort measures Outcome: Not Progressing   Problem: Health Behavior/Discharge Planning: Goal: Ability to manage health-related needs will  improve Outcome: Not Progressing   Problem: Clinical Measurements: Goal: Ability to maintain clinical measurements within normal limits will improve Outcome: Not Progressing Goal: Will remain free from infection Outcome: Not Progressing Goal: Diagnostic test results will improve Outcome: Not Progressing Goal: Respiratory complications will improve Outcome: Not Progressing Goal: Cardiovascular complication will be avoided Outcome: Not Progressing   Problem: Activity: Goal: Risk for activity intolerance will decrease Outcome: Not Progressing   Problem: Nutrition: Goal: Adequate nutrition will be maintained Outcome: Not Progressing   Problem: Coping: Goal: Level of anxiety will decrease Outcome: Not Progressing   Problem: Elimination: Goal: Will not experience complications related to bowel motility Outcome: Not Progressing Goal: Will not experience complications related to urinary retention Outcome: Not Progressing   Problem: Pain Managment: Goal: General experience of comfort will improve and/or be controlled Outcome: Not Progressing   Problem: Safety: Goal: Ability to remain free from injury will improve Outcome: Not Progressing   Problem: Skin Integrity: Goal: Risk for impaired skin integrity will decrease Outcome: Not Progressing   Problem: Education: Goal: Knowledge of disease or condition will improve Outcome: Not Progressing Goal: Knowledge of the prescribed therapeutic regimen will improve Outcome: Not Progressing Goal: Individualized Educational Video(s) Outcome: Not Progressing   Problem: Activity: Goal: Ability to tolerate increased activity will improve Outcome: Not Progressing Goal: Will verbalize the importance of balancing activity with adequate rest periods Outcome: Not Progressing   Problem: Respiratory: Goal: Ability to maintain a clear airway will improve Outcome: Not Progressing Goal: Levels of oxygenation will improve Outcome: Not  Progressing Goal: Ability to maintain adequate ventilation will improve Outcome: Not Progressing
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
## 2023-05-22 NOTE — Progress Notes (Signed)
 Pt. Transported from ED10 to 2M05 without any complications with RN and RT on 100% O2. RT in unit notified on pt. Arrival

## 2023-05-22 NOTE — Progress Notes (Signed)
 Initial Nutrition Assessment  DOCUMENTATION CODES:  Obesity unspecified  INTERVENTION:  Initiate tube feeding via OGT: Osmolite 1.5 at 60 ml/h (1440 ml per day) Start at 91mL/h and advance by 10mL q6h Prosource TF20 60 ml BID Provides 2320 kcal, 130 gm protein, 1097 ml free water daily MVI with minerals daily 1 packet Juven BID, each packet provides 95 calories, 2.5 grams of protein (collagen), + micronutrients to support wound healing  NUTRITION DIAGNOSIS:  Inadequate oral intake related to inability to eat as evidenced by NPO status.  GOAL:  Patient will meet greater than or equal to 90% of their needs  MONITOR:  TF tolerance, I & O's, Vent status, Labs  REASON FOR ASSESSMENT:  Ventilator, Consult Enteral/tube feeding initiation and management  ASSESSMENT:  Pt with hx of COPD, HTN, hx of opiate dependence on methadone  presented to ED with a left leg wound. Developed respiratory distress and was intubated in ED.   Patient is currently intubated on ventilator support. No family present at this time to provide a hx. On exam, pt with no muscle or fat deficits. Significant edema is present to the lower legs and noted that there are wounds to his heels in addition to the injury on his left leg that he presented to ED from.   Pt discussed during ICU rounds and with RN and MD. Damaris Duhamel if pt will be able to extubate today. Was agitated this AM. Ok to start feeds. Receiving some kcal from propofol .  MV: 9.5 L/min Temp (24hrs), Avg:98.9 F (37.2 C), Min:98.2 F (36.8 C), Max:99.3 F (37.4 C) MAP (cuff):  Propofol : 22.1 ml/hr (583 kcal/d)  Admit weight: 147.4 kg  Current weight: 144.7 kg   Intake/Output Summary (Last 24 hours) at 05/22/2023 1501 Last data filed at 05/22/2023 1200 Gross per 24 hour  Intake 1381.93 ml  Output 2500 ml  Net -1118.07 ml  Net IO Since Admission: -1,118.07 mL [05/22/23 1501]  Drains/Lines: OGT 16 Fr. (Gastric) UOP 2500 mL x 24  hours  Nutritionally Relevant Medications: Scheduled Meds:  docusate  100 mg Per Tube BID   famotidine   20 mg Per Tube BID   insulin  aspart  0-15 Units Subcutaneous Q4H   methylPREDNISolone  (SOLU-MEDROL ) injection  125 mg Intravenous Q12H   polyethylene glycol  17 g Per Tube Daily   Continuous Infusions:  cefTRIAXone  (ROCEPHIN )  IV 1 g (05/22/23 0640)   propofol  (DIPRIVAN ) infusion 25 mcg/kg/min (05/22/23 0600)   PRN Meds: polyethylene glycol  Labs Reviewed: BUN 32 CBG ranges from 135-160 mg/dL over the last 24 hours HgbA1c 5.5%  NUTRITION - FOCUSED PHYSICAL EXAM: Flowsheet Row Most Recent Value  Orbital Region No depletion  Upper Arm Region No depletion  Thoracic and Lumbar Region No depletion  Buccal Region No depletion  Temple Region No depletion  Clavicle Bone Region No depletion  Clavicle and Acromion Bone Region No depletion  Scapular Bone Region No depletion  Dorsal Hand Unable to assess  [mittens]  Patellar Region Unable to assess  [edema]  Anterior Thigh Region Unable to assess  [edema]  Posterior Calf Region Unable to assess  [edema]  Edema (RD Assessment) Moderate  Hair Reviewed  Eyes Reviewed  Mouth Reviewed  Skin Reviewed  Nails Unable to assess  [mittens]   Diet Order:   Diet Order             Diet NPO time specified  Diet effective now  EDUCATION NEEDS:  Not appropriate for education at this time  Skin:  Skin Assessment: Reviewed RN Assessment Laceration, left pretibial, stitches in place from ED   Last BM:  unsure  Height:  Ht Readings from Last 1 Encounters:  05/21/23 6' (1.829 m)    Weight:  Wt Readings from Last 1 Encounters:  05/22/23 (!) 144.7 kg    Ideal Body Weight:  80.9 kg  BMI:  Body mass index is 43.26 kg/m.  Estimated Nutritional Needs:  Kcal:  2400-2600 kcal/d Protein:  120-140 g/d Fluid:  2.5L/d    Edwena Graham, RD, LDN Registered Dietitian II Please reach out via secure chat

## 2023-05-22 NOTE — ED Notes (Signed)
 Pt approaches nurses station appearing agitated. Pt yells/stating he wants to see a doctor and wants to get out of here. Pt diaphoretic and showing increased work of breathing. Pt asked to return to room and informed of importance on having O2 on. Pt still refuses. MD notified.
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
## 2023-05-22 NOTE — ED Notes (Signed)
 Time-out performed prior to RSI. ED MD, RT, pharmacy, and second RN present in room for intubation.

## 2023-05-22 NOTE — Code Documentation (Signed)
 Charge nurse notified that patient not alert at this time. ED MD at bedside to assess patient. RT present.

## 2023-05-22 NOTE — Progress Notes (Addendum)
 ABG collected and sent to the lab, lab aware. Unable to collect sputum at this time, will try again later.

## 2023-05-22 NOTE — Progress Notes (Signed)
 eLink Physician-Brief Progress Note Patient Name: Nathaniel [name] DOB: [birth year] MRN: [id number]   Date of Service  05/22/2023  HPI/Events of Note  53 y.o. male with medical history significant for COPD, lymphedema, BMI 44, and opiate dependence who presents with bleeding from a left leg wound.  He developed progressive shock requiring intubation mechanical ventilation and was transferred to the ICU.   He is hypertensive but otherwise normal vitals.  Saturating 100% on 100% FiO2.  Results show with degree of hypercapnia with appropriate oxygenation.  Results also consistent with mild hyperkalemia, normocytic anemia, and radiograph with left basilar infiltrate.  No obvious PE.  Ventilator with no apparent distress on camera examination  eICU Interventions  Maintain ceftriaxone , high-dose steroids, and scheduled SVNs  Daily spontaneous awakening and breathing trials  DVT prophylaxis with SCDs, consider addition of chemoprophylaxis GI prophylaxis with famotidine      Intervention Category Evaluation Type: New Patient Evaluation  Tashunda Vandezande 05/22/2023, 4:54 AM

## 2023-05-22 NOTE — ED Notes (Signed)
 Pt repeatedly trying to take off monitoring equipment. Pt states he needs to get up and use the restroom. This RN explained to patient importance of staying on oxygen and on monitoring equipment, however pt still refuses. Pt states I'm not trying to be no type of way but this is just how I am. Pt continues to remove oxygen and monitoring equipment. EDP notified.

## 2023-05-22 NOTE — ED Notes (Signed)
 ED MD at bedside to see patient. Hospital MD at bedside.

## 2023-05-23 DIAGNOSIS — J9601 Acute respiratory failure with hypoxia: Secondary | ICD-10-CM | POA: Diagnosis not present

## 2023-05-23 DIAGNOSIS — J9602 Acute respiratory failure with hypercapnia: Secondary | ICD-10-CM | POA: Diagnosis not present

## 2023-05-23 DIAGNOSIS — J441 Chronic obstructive pulmonary disease with (acute) exacerbation: Secondary | ICD-10-CM | POA: Diagnosis not present

## 2023-05-23 DIAGNOSIS — S81812D Laceration without foreign body, left lower leg, subsequent encounter: Secondary | ICD-10-CM | POA: Diagnosis not present

## 2023-05-23 DIAGNOSIS — R739 Hyperglycemia, unspecified: Secondary | ICD-10-CM

## 2023-05-23 DIAGNOSIS — D649 Anemia, unspecified: Secondary | ICD-10-CM

## 2023-05-23 LAB — CBC
HCT: 29.6 % — ABNORMAL LOW (ref 39.0–52.0)
Hemoglobin: 8.7 g/dL — ABNORMAL LOW (ref 13.0–17.0)
MCH: 26.1 pg (ref 26.0–34.0)
MCHC: 29.4 g/dL — ABNORMAL LOW (ref 30.0–36.0)
MCV: 88.9 fL (ref 80.0–100.0)
Platelets: 255 10*3/uL (ref 150–400)
RBC: 3.33 MIL/uL — ABNORMAL LOW (ref 4.22–5.81)
RDW: 18.4 % — ABNORMAL HIGH (ref 11.5–15.5)
WBC: 7 10*3/uL (ref 4.0–10.5)
nRBC: 0 % (ref 0.0–0.2)

## 2023-05-23 LAB — MRSA NEXT GEN BY PCR, NASAL: MRSA by PCR Next Gen: NOT DETECTED

## 2023-05-23 LAB — TRIGLYCERIDES: Triglycerides: 147 mg/dL (ref <150)

## 2023-05-23 LAB — BASIC METABOLIC PANEL WITH GFR
Anion gap: 9 (ref 5–15)
BUN: 28 mg/dL — ABNORMAL HIGH (ref 6–20)
CO2: 26 mmol/L (ref 22–32)
Calcium: 8.4 mg/dL — ABNORMAL LOW (ref 8.9–10.3)
Chloride: 101 mmol/L (ref 98–111)
Creatinine, Ser: 0.73 mg/dL (ref 0.61–1.24)
GFR, Estimated: >60 mL/min (ref >60)
Glucose, Bld: 250 mg/dL — ABNORMAL HIGH (ref 70–99)
Potassium: 4.4 mmol/L (ref 3.5–5.1)
Sodium: 136 mmol/L (ref 135–145)

## 2023-05-23 LAB — GLUCOSE, CAPILLARY
Glucose-Capillary: 120 mg/dL — ABNORMAL HIGH (ref 70–99)
Glucose-Capillary: 144 mg/dL — ABNORMAL HIGH (ref 70–99)
Glucose-Capillary: 145 mg/dL — ABNORMAL HIGH (ref 70–99)
Glucose-Capillary: 148 mg/dL — ABNORMAL HIGH (ref 70–99)
Glucose-Capillary: 205 mg/dL — ABNORMAL HIGH (ref 70–99)
Glucose-Capillary: 254 mg/dL — ABNORMAL HIGH (ref 70–99)

## 2023-05-23 LAB — STREP PNEUMONIAE URINARY ANTIGEN: Strep Pneumo Urinary Antigen: NEGATIVE

## 2023-05-23 LAB — MAGNESIUM: Magnesium: 2.4 mg/dL (ref 1.7–2.4)

## 2023-05-23 LAB — PHOSPHORUS
Phosphorus: 1.5 mg/dL — ABNORMAL LOW (ref 2.5–4.6)
Phosphorus: 3.6 mg/dL (ref 2.5–4.6)

## 2023-05-23 MED ORDER — INSULIN GLARGINE-YFGN 100 UNIT/ML ~~LOC~~ SOLN
8.0000 [IU] | Freq: Every day | SUBCUTANEOUS | Status: DC
  Administered 2023-05-23: 8 [IU] via SUBCUTANEOUS
  Filled 2023-05-23 (×3): qty 0.08

## 2023-05-23 MED ORDER — HEPARIN SODIUM (PORCINE) 5000 UNIT/ML IJ SOLN
5000.0000 [IU] | Freq: Three times a day (TID) | INTRAMUSCULAR | Status: DC
Start: 2023-05-23 — End: 2023-05-25
  Administered 2023-05-23 – 2023-05-25 (×7): 5000 [IU] via SUBCUTANEOUS
  Filled 2023-05-23 (×7): qty 1

## 2023-05-23 MED ORDER — ADULT MULTIVITAMIN W/MINERALS CH
1.0000 | ORAL_TABLET | Freq: Every day | ORAL | Status: DC
  Administered 2023-05-24 – 2023-05-25 (×2): 1 via ORAL
  Filled 2023-05-23 (×2): qty 1

## 2023-05-23 MED ORDER — POLYETHYLENE GLYCOL 3350 17 G PO PACK
17.0000 g | PACK | Freq: Every day | ORAL | Status: DC
  Administered 2023-05-24 – 2023-05-25 (×2): 17 g via ORAL
  Filled 2023-05-23 (×3): qty 1

## 2023-05-23 MED ORDER — SODIUM PHOSPHATES 45 MMOLE/15ML IV SOLN
45.0000 mmol | Freq: Once | INTRAVENOUS | Status: AC
  Administered 2023-05-23: 45 mmol via INTRAVENOUS
  Filled 2023-05-23: qty 15

## 2023-05-23 MED ORDER — CHLORHEXIDINE GLUCONATE CLOTH 2 % EX PADS
6.0000 | MEDICATED_PAD | CUTANEOUS | Status: DC
  Administered 2023-05-23: 6 via TOPICAL

## 2023-05-23 MED ORDER — METHADONE HCL 10 MG PO TABS
170.0000 mg | ORAL_TABLET | Freq: Every day | ORAL | Status: DC
  Administered 2023-05-23 – 2023-05-25 (×3): 170 mg via ORAL
  Filled 2023-05-23 (×3): qty 17

## 2023-05-23 MED ORDER — BUTALBITAL-APAP-CAFFEINE 50-325-40 MG PO TABS
1.0000 | ORAL_TABLET | Freq: Once | ORAL | Status: AC
  Administered 2023-05-23: 1 via ORAL
  Filled 2023-05-23: qty 1

## 2023-05-23 MED ORDER — DOCUSATE SODIUM 50 MG/5ML PO LIQD
100.0000 mg | Freq: Two times a day (BID) | ORAL | Status: DC
  Administered 2023-05-24 – 2023-05-25 (×3): 100 mg via ORAL
  Filled 2023-05-23 (×4): qty 10

## 2023-05-23 MED ORDER — MELATONIN 5 MG PO TABS
5.0000 mg | ORAL_TABLET | Freq: Every evening | ORAL | Status: DC | PRN
  Administered 2023-05-23 – 2023-05-24 (×2): 5 mg via ORAL
  Filled 2023-05-23 (×2): qty 1

## 2023-05-23 MED ORDER — FAMOTIDINE 20 MG PO TABS
20.0000 mg | ORAL_TABLET | Freq: Two times a day (BID) | ORAL | Status: DC
  Administered 2023-05-23 – 2023-05-24 (×2): 20 mg via ORAL
  Filled 2023-05-23 (×2): qty 1

## 2023-05-23 NOTE — Progress Notes (Signed)
 [facility] ADULT ICU REPLACEMENT PROTOCOL   The patient does apply for the [facility] Adult ICU Electrolyte Replacment Protocol based on the criteria listed below:   1.Exclusion criteria: TCTS, ECMO, Dialysis, and Myasthenia Gravis patients 2. Is GFR >/= 30 ml/min? Yes.    Patient's GFR today is >60 3. Is SCr </= 2? Yes.   Patient's SCr is 0.73 mg/dL 4. Did SCr increase >/= 0.5 in 24 hours? No. 5.Pt's weight >40kg  Yes.   6. Abnormal electrolyte(s):   Phos 1.5  7. Electrolytes replaced per protocol 8.  Call MD STAT for K+ </= 2.5, Phos </= 1, or Mag </= 1 Physician:  A. Racheal Buddle R Briyanna Billingham 05/23/2023 4:16 AM
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
## 2023-05-23 NOTE — Progress Notes (Addendum)
 eLink Physician-Brief Progress Note Patient Name: CAETANO OBERHAUS DOB: [birth year] MRN: [id number]   Date of Service  05/23/2023  HPI/Events of Note  53 y.o. male with medical history significant for COPD, lymphedema, BMI 44, and opiate dependence who presents with bleeding from a left leg wound.  He developed progressive shock requiring intubation mechanical ventilation and was transferred to the ICU.   Complaining of 12 out of 10 headache, takes methadone  and Goody headache medicine at home  eICU Interventions  Fioricet  x 1   2204 -experiencing insomnia, add melatonin as needed  0115 - still having a headache, wants goody powder. Not available, continue tylenol  as prescribed  Intervention Category Intermediate Interventions: Pain - evaluation and management  Deeya Richeson 05/23/2023, 7:50 PM

## 2023-05-23 NOTE — Plan of Care (Signed)
  Problem: Coping: Goal: Ability to adjust to condition or change in health will improve 05/23/2023 1614 by Pearlene Bouchard, Almon Jaegers, RN Outcome: Progressing 05/23/2023 1614 by Pearlene Bouchard, Almon Jaegers, RN Outcome: Progressing   Problem: Fluid Volume: Goal: Ability to maintain a balanced intake and output will improve Outcome: Progressing   Problem: Metabolic: Goal: Ability to maintain appropriate glucose levels will improve Outcome: Progressing   Problem: Nutritional: Goal: Maintenance of adequate nutrition will improve Outcome: Progressing   Problem: Skin Integrity: Goal: Risk for impaired skin integrity will decrease Outcome: Progressing

## 2023-05-23 NOTE — Procedures (Signed)
 Extubation Procedure Note  Patient Details:   Name: Nathaniel [name] DOB: [birth year] MRN: [id number]   Airway Documentation:    Vent end date: 05/23/23 Vent end time: 0845   Evaluation  O2 sats: stable throughout Complications: No apparent complications Patient did tolerate procedure well. Bilateral Breath Sounds: Clear, Diminished   Yes  Patient was extubated to 4LPM nasal cannula. Had a positive cuff leak prior to extubation. Patient had a strong cough and was able to speak name. Vitals stable. RN at bedside. BBS clear, diminished. No stridor noted. RT will continue to monitor.   [name] M 05/23/2023, 8:51 AM

## 2023-05-24 ENCOUNTER — Other Ambulatory Visit: Payer: Self-pay

## 2023-05-24 DIAGNOSIS — S81812D Laceration without foreign body, left lower leg, subsequent encounter: Secondary | ICD-10-CM | POA: Diagnosis not present

## 2023-05-24 DIAGNOSIS — I890 Lymphedema, not elsewhere classified: Secondary | ICD-10-CM

## 2023-05-24 DIAGNOSIS — J9621 Acute and chronic respiratory failure with hypoxia: Secondary | ICD-10-CM | POA: Diagnosis not present

## 2023-05-24 DIAGNOSIS — J441 Chronic obstructive pulmonary disease with (acute) exacerbation: Secondary | ICD-10-CM | POA: Diagnosis not present

## 2023-05-24 LAB — GLUCOSE, CAPILLARY
Glucose-Capillary: 114 mg/dL — ABNORMAL HIGH (ref 70–99)
Glucose-Capillary: 111 mg/dL — ABNORMAL HIGH (ref 70–99)
Glucose-Capillary: 153 mg/dL — ABNORMAL HIGH (ref 70–99)
Glucose-Capillary: 189 mg/dL — ABNORMAL HIGH (ref 70–99)
Glucose-Capillary: 182 mg/dL — ABNORMAL HIGH (ref 70–99)
Glucose-Capillary: 154 mg/dL — ABNORMAL HIGH (ref 70–99)

## 2023-05-24 LAB — CBC
HCT: 28.6 % — ABNORMAL LOW (ref 39.0–52.0)
Hemoglobin: 8.4 g/dL — ABNORMAL LOW (ref 13.0–17.0)
MCH: 26.6 pg (ref 26.0–34.0)
MCHC: 29.4 g/dL — ABNORMAL LOW (ref 30.0–36.0)
MCV: 90.5 fL (ref 80.0–100.0)
Platelets: 218 10*3/uL (ref 150–400)
RBC: 3.16 MIL/uL — ABNORMAL LOW (ref 4.22–5.81)
RDW: 18.8 % — ABNORMAL HIGH (ref 11.5–15.5)
WBC: 8.8 10*3/uL (ref 4.0–10.5)
nRBC: 0 % (ref 0.0–0.2)

## 2023-05-24 LAB — BASIC METABOLIC PANEL WITH GFR
Anion gap: 7 (ref 5–15)
BUN: 23 mg/dL — ABNORMAL HIGH (ref 6–20)
CO2: 29 mmol/L (ref 22–32)
Calcium: 8.2 mg/dL — ABNORMAL LOW (ref 8.9–10.3)
Chloride: 101 mmol/L (ref 98–111)
Creatinine, Ser: 0.7 mg/dL (ref 0.61–1.24)
GFR, Estimated: >60 mL/min (ref >60)
Glucose, Bld: 114 mg/dL — ABNORMAL HIGH (ref 70–99)
Potassium: 4.2 mmol/L (ref 3.5–5.1)
Sodium: 137 mmol/L (ref 135–145)

## 2023-05-24 LAB — PHOSPHORUS: Phosphorus: 4 mg/dL (ref 2.5–4.6)

## 2023-05-24 LAB — MAGNESIUM: Magnesium: 2.4 mg/dL (ref 1.7–2.4)

## 2023-05-24 MED ORDER — FUROSEMIDE 10 MG/ML IJ SOLN
40.0000 mg | Freq: Four times a day (QID) | INTRAMUSCULAR | Status: AC
Start: 2023-05-24 — End: 2023-05-24
  Administered 2023-05-24 (×2): 40 mg via INTRAVENOUS
  Filled 2023-05-24 (×2): qty 4

## 2023-05-24 MED ORDER — INSULIN GLARGINE-YFGN 100 UNIT/ML ~~LOC~~ SOLN
6.0000 [IU] | Freq: Every day | SUBCUTANEOUS | Status: DC
  Administered 2023-05-24: 6 [IU] via SUBCUTANEOUS
  Filled 2023-05-24 (×2): qty 0.06

## 2023-05-24 MED ORDER — ASPIRIN-ACETAMINOPHEN-CAFFEINE 250-250-65 MG PO TABS
1.0000 | ORAL_TABLET | Freq: Four times a day (QID) | ORAL | Status: DC | PRN
  Administered 2023-05-24: 1 via ORAL
  Filled 2023-05-24 (×4): qty 1

## 2023-05-24 MED ORDER — JUVEN PO PACK
1.0000 | PACK | Freq: Two times a day (BID) | ORAL | Status: DC
  Administered 2023-05-24 – 2023-05-25 (×3): 1 via ORAL
  Filled 2023-05-24 (×2): qty 1

## 2023-05-24 MED ORDER — ASPIRIN 325 MG PO TABS: 325.0000 mg | ORAL_TABLET | Freq: Two times a day (BID) | ORAL | Status: DC | PRN

## 2023-05-24 NOTE — Evaluation (Signed)
 Physical Therapy Evaluation Patient Details Name: Nathaniel [name] MRN: [id number] DOB: 06/01/1970 Today's Date: 05/24/2023  History of Present Illness  Pt is 53 year old presented to [facility] on  05/21/23 for lt leg laceration and found to be in copd exacerbation refusing bipap. Developed shock and required intubation. Extubated 4/18 . PMH - copd, lymphedema, morbid obesity, opiate dependence  Clinical Impression  Pt admitted with above diagnosis and presents to PT with functional limitations due to deficits listed below (See PT problem list). Pt needs skilled PT to maximize independence and safety. Pt should make steady progress with mobility and be able to return home. Did well with rolling walker but would like to try some different assistive devices.           If plan is discharge home, recommend the following: Assist for transportation;Assistance with cooking/housework   Can travel by private Manufacturing systems engineer walker (2 wheels);Rollator (4 wheels) (RW vs rollator)  Recommendations for Other Services       Functional Status Assessment Patient has had a recent decline in their functional status and demonstrates the ability to make significant improvements in function in a reasonable and predictable amount of time.     Precautions / Restrictions Precautions Precautions: Fall Restrictions Weight Bearing Restrictions Per Provider Order: No      Mobility  Bed Mobility Overal bed mobility: Needs Assistance Bed Mobility: Supine to Sit     Supine to sit: Supervision, HOB elevated, Used rails     General bed mobility comments: Incr time and effort    Transfers Overall transfer level: Needs assistance Equipment used: None, Rolling walker (2 wheels) Transfers: Sit to/from Stand Sit to Stand: Contact guard assist           General transfer comment: Assist for safety and lines. Pt uses momentum to come to stand     Ambulation/Gait Ambulation/Gait assistance: Contact guard assist Gait Distance (Feet): 160 Feet Assistive device: None, Rolling walker (2 wheels) Gait Pattern/deviations: Step-through pattern, Decreased stride length, Trunk flexed, Wide base of support Gait velocity: decr Gait velocity interpretation: 1.31 - 2.62 ft/sec, indicative of limited community ambulator   General Gait Details: Initially amb without device and unsteady and reaching for external support. With walker much more stable. Verbal cues to stay closer to walker.  Stairs            Wheelchair Mobility     Tilt Bed    Modified Rankin (Stroke Patients Only)       Balance Overall balance assessment: Needs assistance Sitting-balance support: No upper extremity supported, Feet supported Sitting balance-Leahy Scale: Good     Standing balance support: No upper extremity supported, During functional activity Standing balance-Leahy Scale: Fair                               Pertinent Vitals/Pain Pain Assessment Pain Assessment: Faces Faces Pain Scale: Hurts little more Pain Location: LLE Pain Descriptors / Indicators: Grimacing, Guarding Pain Intervention(s): Limited activity within patient's tolerance, Monitored during session, Repositioned    Home Living Family/patient expects to be discharged to:: Private residence Living Arrangements: Children (Son) Available Help at Discharge: Family;Available PRN/intermittently Type of Home: House Home Access: Level entry       Home Layout: One level Home Equipment: None Additional Comments: Son works and not available to help per pt    Prior Function Prior Level of  Function : Independent/Modified Independent             Mobility Comments: No assistive device. Doesn't drive       Extremity/Trunk Assessment   Upper Extremity Assessment Upper Extremity Assessment: Defer to OT evaluation    Lower Extremity Assessment Lower Extremity  Assessment: Generalized weakness       Communication   Communication Communication: No apparent difficulties    Cognition Arousal: Alert Behavior During Therapy: WFL for tasks assessed/performed   PT - Cognitive impairments: No apparent impairments                         Following commands: Intact       Cueing Cueing Techniques: Verbal cues     General Comments General comments (skin integrity, edema, etc.): On RA SpO2 86%. Replace O2 at 2L with SpO2 90%    Exercises     Assessment/Plan    PT Assessment Patient needs continued PT services  PT Problem List Decreased strength;Decreased activity tolerance;Decreased balance;Decreased mobility;Obesity       PT Treatment Interventions DME instruction;Gait training;Functional mobility training;Therapeutic activities;Therapeutic exercise;Balance training;Patient/family education    PT Goals (Current goals can be found in the Care Plan section)  Acute Rehab PT Goals Patient Stated Goal: return home PT Goal Formulation: With patient Time For Goal Achievement: 06/07/23 Potential to Achieve Goals: Good    Frequency Min 2X/week     Co-evaluation               AM-PAC PT "6 Clicks" Mobility  Outcome Measure Help needed turning from your back to your side while in a flat bed without using bedrails?: A Little Help needed moving from lying on your back to sitting on the side of a flat bed without using bedrails?: A Little Help needed moving to and from a bed to a chair (including a wheelchair)?: A Little Help needed standing up from a chair using your arms (e.g., wheelchair or bedside chair)?: A Little Help needed to walk in hospital room?: A Little Help needed climbing 3-5 steps with a railing? : A Little 6 Click Score: 18    End of Session Equipment Utilized During Treatment: Oxygen Activity Tolerance: Patient tolerated treatment well Patient left: in chair;with call bell/phone within reach;with  nursing/sitter in room Nurse Communication: Mobility status PT Visit Diagnosis: Unsteadiness on feet (R26.81);Other abnormalities of gait and mobility (R26.89);Muscle weakness (generalized) (M62.81)    Time: 7829-5621 PT Time Calculation (min) (ACUTE ONLY): 28 min   Charges:   PT Evaluation $PT Eval Moderate Complexity: 1 Mod   PT General Charges $$ ACUTE PT VISIT: 1 Visit         [facility] PT Acute Rehabilitation Services Office [phone]   [facility] [facility] 05/24/2023, 11:26 AM
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
## 2023-05-25 ENCOUNTER — Encounter (HOSPITAL_COMMUNITY): Payer: Self-pay | Admitting: Critical Care Medicine

## 2023-05-25 DIAGNOSIS — J9602 Acute respiratory failure with hypercapnia: Secondary | ICD-10-CM | POA: Diagnosis not present

## 2023-05-25 DIAGNOSIS — J441 Chronic obstructive pulmonary disease with (acute) exacerbation: Secondary | ICD-10-CM | POA: Diagnosis not present

## 2023-05-25 DIAGNOSIS — S81812D Laceration without foreign body, left lower leg, subsequent encounter: Secondary | ICD-10-CM

## 2023-05-25 DIAGNOSIS — J9601 Acute respiratory failure with hypoxia: Secondary | ICD-10-CM

## 2023-05-25 LAB — CBC
HCT: 31 % — ABNORMAL LOW (ref 39.0–52.0)
Hemoglobin: 9.3 g/dL — ABNORMAL LOW (ref 13.0–17.0)
MCH: 26.7 pg (ref 26.0–34.0)
MCHC: 30 g/dL (ref 30.0–36.0)
MCV: 89.1 fL (ref 80.0–100.0)
Platelets: 200 10*3/uL (ref 150–400)
RBC: 3.48 MIL/uL — ABNORMAL LOW (ref 4.22–5.81)
RDW: 18.5 % — ABNORMAL HIGH (ref 11.5–15.5)
WBC: 7.4 10*3/uL (ref 4.0–10.5)
nRBC: 0 % (ref 0.0–0.2)

## 2023-05-25 LAB — BASIC METABOLIC PANEL WITH GFR
Anion gap: 9 (ref 5–15)
BUN: 23 mg/dL — ABNORMAL HIGH (ref 6–20)
CO2: 35 mmol/L — ABNORMAL HIGH (ref 22–32)
Calcium: 8.7 mg/dL — ABNORMAL LOW (ref 8.9–10.3)
Chloride: 94 mmol/L — ABNORMAL LOW (ref 98–111)
Creatinine, Ser: 0.81 mg/dL (ref 0.61–1.24)
GFR, Estimated: >60 mL/min (ref >60)
Glucose, Bld: 97 mg/dL (ref 70–99)
Potassium: 3.8 mmol/L (ref 3.5–5.1)
Sodium: 138 mmol/L (ref 135–145)

## 2023-05-25 LAB — LEGIONELLA PNEUMOPHILA SEROGP 1 UR AG: L. pneumophila Serogp 1 Ur Ag: NEGATIVE

## 2023-05-25 LAB — GLUCOSE, CAPILLARY
Glucose-Capillary: 145 mg/dL — ABNORMAL HIGH (ref 70–99)
Glucose-Capillary: 178 mg/dL — ABNORMAL HIGH (ref 70–99)
Glucose-Capillary: 184 mg/dL — ABNORMAL HIGH (ref 70–99)
Glucose-Capillary: 88 mg/dL (ref 70–99)

## 2023-05-25 MED ORDER — DOXYCYCLINE HYCLATE 100 MG PO TABS: 100.0000 mg | ORAL_TABLET | Freq: Two times a day (BID) | ORAL | 0 refills | Status: AC

## 2023-05-25 MED ORDER — ALBUTEROL SULFATE HFA 108 (90 BASE) MCG/ACT IN AERS: 2.0000 | INHALATION_SPRAY | Freq: Four times a day (QID) | RESPIRATORY_TRACT | 2 refills | Status: AC | PRN

## 2023-05-25 MED ORDER — PANTOPRAZOLE SODIUM 40 MG PO TBEC: 40.0000 mg | DELAYED_RELEASE_TABLET | Freq: Every day | ORAL | 1 refills | Status: DC

## 2023-05-25 MED ORDER — BUDESONIDE-FORMOTEROL FUMARATE 160-4.5 MCG/ACT IN AERO: 2.0000 | INHALATION_SPRAY | Freq: Two times a day (BID) | RESPIRATORY_TRACT | 12 refills | Status: AC

## 2023-05-25 MED ORDER — PREDNISONE 20 MG PO TABS: ORAL_TABLET | ORAL | 0 refills | Status: DC

## 2023-05-25 NOTE — Discharge Summary (Addendum)
 Physician Discharge Summary   Patient: Nathaniel [name] MRN: [id number] DOB: 06-25-1970  Admit date:     05/21/2023  Discharge date: {dischdate:26783}  Discharge Physician: Justina Oman   PCP: Pcp, No   Recommendations at discharge:  {Tip this will not be part of the note when signed- Example include specific recommendations for outpatient follow-up, pending tests to follow-up on. (Optional):26781}  ***  Discharge Diagnoses: Principal Problem:   COPD with acute exacerbation (HCC) Active Problems:   Acute on chronic respiratory failure with hypoxia and hypercapnia (HCC)   Laceration of left leg excluding thigh   Acute respiratory failure (HCC)  Resolved Problems:   * No resolved hospital problems. [facility] Course: No notes on file  Assessment and Plan: No notes have been filed under this hospital service. Service: Hospitalist     {Tip this will not be part of the note when signed Body mass index is 43.26 kg/m. ,  Nutrition Documentation    Flowsheet Row ED to Hosp-Admission (Current) from 05/21/2023 in [facility]  Nutrition Problem Inadequate oral intake  Etiology inability to eat  Nutrition Goal Patient will meet greater than or equal to 90% of their needs  Interventions Prostat, Tube feeding, Juven, MVI     ,  (Optional):26781}  {(NOTE) Pain control PDMP Statment (Optional):26782} Consultants: *** Procedures performed: ***  Disposition: {Plan; Disposition:26390} Diet recommendation:  Discharge Diet Orders (From admission, onward)     Start     Ordered   05/25/23 0000  Diet - low sodium heart healthy        05/25/23 1155           {Diet_Plan:26776} DISCHARGE MEDICATION: Allergies as of 05/25/2023       Reactions   Ibuprofen Nausea Only        Medication List     TAKE these medications    albuterol  108 (90 Base) MCG/ACT inhaler Commonly known as: VENTOLIN  HFA Inhale 2 puffs into the lungs every 6 (six)  hours as needed for wheezing or shortness of breath.   budesonide -formoterol  160-4.5 MCG/ACT inhaler Commonly known as: Symbicort  Inhale 2 puffs into the lungs in the morning and at bedtime.   doxycycline  100 MG tablet Commonly known as: VIBRA -TABS Take 1 tablet (100 mg total) by mouth 2 (two) times daily for 5 days.   GOODY HEADACHE PO Take 1 packet by mouth daily as needed (headache/pain).   methadone  10 MG/5ML solution Commonly known as: DOLOPHINE  Take 175 mg by mouth daily. Liquid Form   pantoprazole  40 MG tablet Commonly known as: Protonix  Take 1 tablet (40 mg total) by mouth daily.   predniSONE  20 MG tablet Commonly known as: DELTASONE  Take 2 tabs daily X 1 day; then 1 tablet daily X 2 days; then 1/2 tablet daily X 3 days and stop prednisone  Start taking on: May 26, 2023               Discharge Care Instructions  (From admission, onward)           Start     Ordered   05/25/23 0000  Discharge wound care:       Comments: Keep area clean and dry; left shin Vaseline gauze and coban to cover sutures BID.   05/25/23 1155            Follow-up Information     [facility]. Schedule an appointment as soon as possible for a visit  in 2 week(s).   Specialty: Pulmonology Why: for PFT's, sleep study and further management of COPD. Contact information: [address] [place]  [address] [phone] Additional information: [address]  [address]  [place], [place] [address]        [place], [place]. Schedule an appointment as soon as possible for a visit in 10 day(s).   Specialty: Physician Assistant Contact information: [phone] [place] [address] [phone]                Discharge Exam: Nathaniel [name] Weights   05/21/23 1736 05/22/23 0439 05/23/23 0347  Weight: (!) 147.4 kg (!) 144.7 kg (!) 144.7 kg   ***  Condition at discharge: {DC Condition:26389}  The results  of significant diagnostics from this hospitalization (including imaging, microbiology, ancillary and laboratory) are listed below for reference.   Imaging Studies: ECHOCARDIOGRAM COMPLETE Result Date: 05/22/2023    ECHOCARDIOGRAM REPORT   Patient Name:   Nathaniel [name] Date of Exam: 05/22/2023 Medical Rec #:  [id number]     Height:       72.0 in Accession #:    [id number]    Weight:       319.0 lb Date of Birth:  08-11-1970     BSA:          2.598 m Patient Age:    53 years      BP:           132/73 mmHg Patient Gender: M             HR:           80 bpm. Exam Location:  Inpatient Procedure: 2D Echo (Both Spectral and Color Flow Doppler were utilized during            procedure). Indications:    acute respiratory distress  History:        Patient has prior history of Echocardiogram examinations, most                 recent 06/30/2019. COPD.  Sonographer:    Dione Franks RDCS Referring Phys: [id number] Kendra Pavy SMITH IMPRESSIONS  1. Poor acoustic windows.  2. Left ventricular ejection fraction, by estimation, is 60 to 65%. The left ventricle has normal function. The left ventricular internal cavity size was mildly to moderately dilated. Left ventricular diastolic parameters were normal.  3. Right ventricular systolic function is normal. The right ventricular size is normal.  4. Left atrial size was mildly dilated.  5. The mitral valve is normal in structure. Trivial mitral valve regurgitation.  6. The aortic valve has an indeterminant number of cusps. Aortic valve regurgitation is not visualized. No aortic stenosis is present. Comparison(s): The left ventricular function is unchanged. FINDINGS  Left Ventricle: Left ventricular ejection fraction, by estimation, is 60 to 65%. The left ventricle has normal function. The left ventricular internal cavity size was mildly to moderately dilated. There is no left ventricular hypertrophy. Left ventricular diastolic parameters were normal. Right Ventricle: The right  ventricular size is normal. Right vetricular wall thickness was not assessed. Right ventricular systolic function is normal. Left Atrium: Left atrial size was mildly dilated. Right Atrium: Right atrial size was normal in size. Pericardium: There is no evidence of pericardial effusion. Mitral Valve: The mitral valve is normal in structure. Trivial mitral valve regurgitation. Tricuspid Valve: The tricuspid valve is normal in structure. Tricuspid valve regurgitation is trivial. Aortic Valve: The aortic valve has  an indeterminant number of cusps. Aortic valve regurgitation is not visualized. No aortic stenosis is present. Pulmonic Valve: The pulmonic valve was normal in structure. Pulmonic valve regurgitation is not visualized. Aorta: The aortic root and ascending aorta are structurally normal, with no evidence of dilitation. IAS/Shunts: No atrial level shunt detected by color flow Doppler.  LEFT VENTRICLE PLAX 2D LVIDd:         6.00 cm   Diastology LVIDs:         4.00 cm   LV e' medial:    8.38 cm/s LV PW:         1.10 cm   LV E/e' medial:  12.1 LV IVS:        1.00 cm   LV e' lateral:   13.60 cm/s LVOT diam:     2.50 cm   LV E/e' lateral: 7.4 LV SV:         116 LV SV Index:   45 LVOT Area:     4.91 cm  RIGHT VENTRICLE             IVC RV S prime:     14.10 cm/s  IVC diam: 2.70 cm TAPSE (M-mode): 2.3 cm LEFT ATRIUM           Index        RIGHT ATRIUM           Index LA diam:      4.70 cm 1.81 cm/m   RA Area:     17.20 cm LA Vol (A4C): 93.8 ml 36.10 ml/m  RA Volume:   44.60 ml  17.17 ml/m  AORTIC VALVE LVOT Vmax:   126.00 cm/s LVOT Vmean:  81.500 cm/s LVOT VTI:    0.237 m  AORTA Ao Root diam: 3.30 cm Ao Asc diam:  3.30 cm MITRAL VALVE MV Area (PHT): 3.31 cm     SHUNTS MV Decel Time: 229 msec     Systemic VTI:  0.24 m MV E velocity: 101.00 cm/s  Systemic Diam: 2.50 cm MV A velocity: 78.80 cm/s MV E/A ratio:  1.28 Ola Berger MD Electronically signed by Ola Berger MD Signature Date/Time: 05/22/2023/6:33:12 PM    Final     DG Chest Port 1 View Result Date: 05/22/2023 CLINICAL DATA:  Check endotracheal tube placement EXAM: PORTABLE CHEST 1 VIEW COMPARISON:  Film from the previous day. FINDINGS: Endotracheal tube is now seen in satisfactory position. Gastric catheter extends into the stomach. The lungs are well aerated with increasing left basilar airspace opacity. Central vascular congestion is noted as well. IMPRESSION: Increasing left basilar airspace opacity. Tubes and lines in satisfactory position. Electronically Signed   By: Violeta Grey M.D.   On: 05/22/2023 03:36   DG Abdomen 1 View Result Date: 05/22/2023 CLINICAL DATA:  Check gastric catheter placement EXAM: ABDOMEN - 1 VIEW COMPARISON:  None Available. FINDINGS: Gastric catheter is noted within the stomach. No free air is noted. No obstructive changes are seen. IMPRESSION: Gastric catheter within the stomach. Electronically Signed   By: Violeta Grey M.D.   On: 05/22/2023 03:36   CT Angio Chest PE W and/or Wo Contrast Result Date: 05/21/2023 CLINICAL DATA:  Pulmonary embolism (PE) suspected, high prob. Cough. EXAM: CT ANGIOGRAPHY CHEST WITH CONTRAST TECHNIQUE: Multidetector CT imaging of the chest was performed using the standard protocol during bolus administration of intravenous contrast. Multiplanar CT image reconstructions and MIPs were obtained to evaluate the vascular anatomy. RADIATION DOSE REDUCTION: This exam was performed according to the  departmental dose-optimization program which includes automated exposure control, adjustment of the mA and/or kV according to patient size and/or use of iterative reconstruction technique. CONTRAST:  75mL OMNIPAQUE  IOHEXOL  350 MG/ML SOLN COMPARISON:  06/28/2019 FINDINGS: Cardiovascular: Suboptimal opacification of the pulmonary arteries despite repeating the study. No large central or segmental pulmonary embolus. Heart is normal size. Aorta is normal caliber. Mediastinum/Nodes: No mediastinal, hilar, or axillary  adenopathy. Trachea and esophagus are unremarkable. Thyroid unremarkable. Lungs/Pleura: Right basilar linear opacities most likely atelectasis. No confluent opacities or effusions. Upper Abdomen: Low-density throughout the liver compatible with fatty infiltration. No acute findings. Musculoskeletal: Chest wall soft tissues are unremarkable. No acute bony abnormality. Review of the MIP images confirms the above findings. IMPRESSION: Suboptimal opacification of the pulmonary arteries. No large or segmental pulmonary emboli seen. Right base atelectasis. Hepatic steatosis. No acute cardiopulmonary disease. Electronically Signed   By: Janeece Mechanic M.D.   On: 05/21/2023 22:31   DG Chest Port 1 View Result Date: 05/21/2023 CLINICAL DATA:  Cough EXAM: PORTABLE CHEST 1 VIEW COMPARISON:  06/28/2019 FINDINGS: Cardiac shadow is stable. Mild vascular congestion is noted centrally. No focal infiltrate or effusion is seen. No bony abnormality is noted. IMPRESSION: Mild vascular congestion. Electronically Signed   By: Violeta Grey M.D.   On: 05/21/2023 20:25    Microbiology: Results for orders placed or performed during the hospital encounter of 05/21/23  MRSA Next Gen by PCR, Nasal     Status: None   Collection Time: 05/22/23  9:57 PM   Specimen: Nasal Mucosa; Nasal Swab  Result Value Ref Range Status   MRSA by PCR Next Gen NOT DETECTED NOT DETECTED Final    Comment: (NOTE) The GeneXpert MRSA Assay (FDA approved for NASAL specimens only), is one component of a comprehensive MRSA colonization surveillance program. It is not intended to diagnose MRSA infection nor to guide or monitor treatment for MRSA infections. Test performance is not FDA approved in patients less than 66 years old. Performed at [facility], [address] [place], [place] [address]   Respiratory (~20 pathogens) panel by PCR     Status: None   Collection Time: 05/22/23 10:02 PM   Specimen: Nasopharyngeal Swab; Respiratory  Result  Value Ref Range Status   Adenovirus NOT DETECTED NOT DETECTED Final   Coronavirus 229E NOT DETECTED NOT DETECTED Final    Comment: (NOTE) The Coronavirus on the Respiratory Panel, DOES NOT test for the novel  Coronavirus (2019 nCoV)    Coronavirus HKU1 NOT DETECTED NOT DETECTED Final   Coronavirus NL63 NOT DETECTED NOT DETECTED Final   Coronavirus OC43 NOT DETECTED NOT DETECTED Final   Metapneumovirus NOT DETECTED NOT DETECTED Final   Rhinovirus / Enterovirus NOT DETECTED NOT DETECTED Final   Influenza A NOT DETECTED NOT DETECTED Final   Influenza B NOT DETECTED NOT DETECTED Final   Parainfluenza Virus 1 NOT DETECTED NOT DETECTED Final   Parainfluenza Virus 2 NOT DETECTED NOT DETECTED Final   Parainfluenza Virus 3 NOT DETECTED NOT DETECTED Final   Parainfluenza Virus 4 NOT DETECTED NOT DETECTED Final   Respiratory Syncytial Virus NOT DETECTED NOT DETECTED Final   Bordetella pertussis NOT DETECTED NOT DETECTED Final   Bordetella Parapertussis NOT DETECTED NOT DETECTED Final   Chlamydophila pneumoniae NOT DETECTED NOT DETECTED Final   Mycoplasma pneumoniae NOT DETECTED NOT DETECTED Final    Comment: Performed at [facility], [address], [place], [place] [address]  Resp panel by RT-PCR (RSV, Flu A&B,  Covid) Anterior Nasal Swab     Status: None   Collection Time: 05/22/23 10:02 PM   Specimen: Anterior Nasal Swab  Result Value Ref Range Status   SARS Coronavirus 2 by RT PCR NEGATIVE NEGATIVE Final   Influenza A by PCR NEGATIVE NEGATIVE Final   Influenza B by PCR NEGATIVE NEGATIVE Final    Comment: (NOTE) The Xpert Xpress SARS-CoV-2/FLU/RSV plus assay is intended as an aid in the diagnosis of influenza from Nasopharyngeal swab specimens and should not be used as a sole basis for treatment. Nasal washings and aspirates are unacceptable for Xpert Xpress SARS-CoV-2/FLU/RSV testing.  Fact Sheet for Patients: BloggerCourse.com  Fact Sheet for  Healthcare Providers: SeriousBroker.it  This test is not yet approved or cleared by the [place]  FDA and has been authorized for detection and/or diagnosis of SARS-CoV-2 by FDA under an Emergency Use Authorization (EUA). This EUA will remain in effect (meaning this test can be used) for the duration of the COVID-19 declaration under Section 564(b)(1) of the Act, 21 U.S.C. section 360bbb-3(b)(1), unless the authorization is terminated or revoked.     Resp Syncytial Virus by PCR NEGATIVE NEGATIVE Final    Comment: (NOTE) Fact Sheet for Patients: BloggerCourse.com  Fact Sheet for Healthcare Providers: SeriousBroker.it  This test is not yet approved or cleared by the [place]  FDA and has been authorized for detection and/or diagnosis of SARS-CoV-2 by FDA under an Emergency Use Authorization (EUA). This EUA will remain in effect (meaning this test can be used) for the duration of the COVID-19 declaration under Section 564(b)(1) of the Act, 21 U.S.C. section 360bbb-3(b)(1), unless the authorization is terminated or revoked.  Performed at [facility], [address] [place], [place] [address]     Labs: CBC: Recent Labs  Lab 05/21/23 1845 05/21/23 1925 05/21/23 2243 05/22/23 0641 05/23/23 0225 05/24/23 0215 05/25/23 0756  WBC 10.7*  --   --  6.7 7.0 8.8 7.4  NEUTROABS 8.4*  --   --   --   --   --   --   HGB 9.6*   < > 11.2* 9.5* 8.7* 8.4* 9.3*  HCT 32.2*   < > 33.0* 30.8* 29.6* 28.6* 31.0*  MCV 89.9  --   --  87.7 88.9 90.5 89.1  PLT 290  --   --  239 255 218 200   < > = values in this interval not displayed.   Basic Metabolic Panel: Recent Labs  Lab 05/21/23 1845 05/21/23 1925 05/21/23 2243 05/22/23 0641 05/23/23 0225 05/23/23 2007 05/24/23 0215 05/25/23 0756  NA 135   < > 135 135 136  --  137 138  K 4.8   < > 5.3* 5.1 4.4  --  4.2 3.8  CL 102  --   --  103 101  --   101 94*  CO2 24  --   --  24 26  --  29 35*  GLUCOSE 126*  --   --  160* 250*  --  114* 97  BUN 39*  --   --  32* 28*  --  23* 23*  CREATININE 1.23  --   --  0.90 0.73  --  0.70 0.81  CALCIUM 8.2*  --   --  8.3* 8.4*  --  8.2* 8.7*  MG  --   --   --   --  2.4  --  2.4  --   PHOS  --   --   --   --  1.5* 3.6 4.0  --    < > = values in this interval not displayed.   Liver Function Tests: No results for input(s): "AST", "ALT", "ALKPHOS", "BILITOT", "PROT", "ALBUMIN" in the last 168 hours. CBG: Recent Labs  Lab 05/24/23 1715 05/24/23 2133 05/25/23 0021 05/25/23 0408 05/25/23 0852  GLUCAP 182* 154* 178* 145* 88    Discharge time spent: {LESS THAN/GREATER HQIO:96295} 30 minutes.  Signed: Justina Oman, MD [facility] 05/25/2023

## 2023-05-25 NOTE — Progress Notes (Signed)
 Mobility Specialist Progress Note:    05/25/23 1100  Oxygen Therapy  O2 Device Room Air  Mobility  Activity Ambulated independently in hallway  Level of Assistance Standby assist, set-up cues, supervision of patient - no hands on  Assistive Device None  Distance Ambulated (ft) 140 ft  Activity Response Tolerated well  Mobility Referral Yes  Mobility visit 1 Mobility  Mobility Specialist Start Time (ACUTE ONLY) 1055  Mobility Specialist Stop Time (ACUTE ONLY) 1102  Mobility Specialist Time Calculation (min) (ACUTE ONLY) 7 min   Received pt on EOB having no complaints and agreeable to mobility. VSS throughout on room air. Pt was asymptomatic throughout ambulation and returned to room w/o fault. Left ob EOB w/ call bell in reach and all needs met. MD present.   D'Vante Nolon Baxter Mobility Specialist Please contact via Special educational needs teacher or Rehab office at [phone]

## 2023-05-25 NOTE — Evaluation (Signed)
 Occupational Therapy Evaluation Patient Details Name: Nathaniel [name] MRN: [id number] DOB: 1971/01/23 Today's Date: 05/25/2023   History of Present Illness   Pt is 53 year old presented to [facility] on  05/21/23 for lt leg laceration and found to be in copd exacerbation refusing bipap. Developed shock and required intubation. Extubated 4/18 . PMH - copd, lymphedema, morbid obesity, opiate dependence     Clinical Impressions PTA patient independent with ADLs and IADLs. Admitted for above and presents with problem list below. Pt currently able to manage Adls with independence, completing transfers and mobility in room on RA with independence. Discussed energy conservation techniques and recommendations for safety, pt voiced understanding.  No further OT needs identified and OT will sign off.      If plan is discharge home, recommend the following:   Assistance with cooking/housework;Assist for transportation     Functional Status Assessment   Patient has not had a recent decline in their functional status     Equipment Recommendations   None recommended by OT     Recommendations for Other Services         Precautions/Restrictions   Precautions Precautions: Fall Restrictions Weight Bearing Restrictions Per Provider Order: No     Mobility Bed Mobility               General bed mobility comments: OOB EOB upon entry    Transfers Overall transfer level: Modified independent Equipment used: None                      Balance Overall balance assessment: Mild deficits observed, not formally tested                                         ADL either performed or assessed with clinical judgement   ADL Overall ADL's : Modified independent                                       General ADL Comments: discussed safety with shower transfers (when cleared by MD and following precuations for L LE wound),  pt able to manage ADls  and transfers in room without assist     Vision   Vision Assessment?: No apparent visual deficits     Perception         Praxis         Pertinent Vitals/Pain Pain Assessment Pain Assessment: 0-10 Pain Score: 5  Pain Location: LLE Pain Descriptors / Indicators: Grimacing, Guarding Pain Intervention(s): Limited activity within patient's tolerance, Monitored during session, Repositioned     Extremity/Trunk Assessment Upper Extremity Assessment Upper Extremity Assessment: Overall WFL for tasks assessed   Lower Extremity Assessment Lower Extremity Assessment: Defer to PT evaluation       Communication Communication Communication: No apparent difficulties   Cognition Arousal: Alert Behavior During Therapy: WFL for tasks assessed/performed Cognition: No apparent impairments                               Following commands: Intact       Cueing  General Comments   Cueing Techniques: Verbal cues  pt on RA upon entry and no s/s of desaturation during session; discussed energy conservation techniques and safety recommendations  Exercises     Shoulder Instructions      Home Living Family/patient expects to be discharged to:: Private residence Living Arrangements: Children Available Help at Discharge: Family;Available PRN/intermittently Type of Home: House Home Access: Level entry     Home Layout: One level     Bathroom Shower/Tub: Tub/shower unit;Walk-in shower   Bathroom Toilet: Standard     Home Equipment: Shower seat - built in;Grab bars - Chartered loss adjuster (2 wheels)   Additional Comments: Son works and not available to help per pt      Prior Functioning/Environment Prior Level of Function : Independent/Modified Independent               ADLs Comments: does not drive, manages ADLs and IADLs    OT Problem List: Decreased activity tolerance;Impaired balance (sitting and/or standing);Decreased knowledge of use of DME or  AE;Decreased knowledge of precautions   OT Treatment/Interventions:        OT Goals(Current goals can be found in the care plan section)   Acute Rehab OT Goals Patient Stated Goal: home today OT Goal Formulation: With patient   OT Frequency:       Co-evaluation              AM-PAC OT "6 Clicks" Daily Activity     Outcome Measure Help from another person eating meals?: None Help from another person taking care of personal grooming?: None Help from another person toileting, which includes using toliet, bedpan, or urinal?: None Help from another person bathing (including washing, rinsing, drying)?: None Help from another person to put on and taking off regular upper body clothing?: None Help from another person to put on and taking off regular lower body clothing?: None 6 Click Score: 24   End of Session Nurse Communication: Mobility status  Activity Tolerance: Patient tolerated treatment well Patient left: Other (comment);with call bell/phone within reach (EOB)  OT Visit Diagnosis: Other abnormalities of gait and mobility (R26.89)                Time: 6962-9528 OT Time Calculation (min): 11 min Charges:  OT General Charges $OT Visit: 1 Visit OT Evaluation $OT Eval Low Complexity: 1 Low  Bary Boss, OT Acute Rehabilitation Services Office [phone] Secure Chat Preferred    Fredrich Jefferson 05/25/2023, 1:30 PM

## 2023-05-25 NOTE — Care Management Important Message (Signed)
 Important Message  Patient Details  Name: Nathaniel [name] MRN: [id number] Date of Birth: Jan 30, 1971   Important Message Given:  Yes - Medicare IM     Felix Host 05/25/2023, 4:26 PM

## 2023-05-25 NOTE — Progress Notes (Signed)
 Explained discharge instructions to patient. Reviewed follow up appointment and next medication administration times. Also reviewed education. Patient verbalized having an understanding for instructions given. All belongings are in the patient's possession. IV and telemetry were removed by [facility], patient's RN. No other needs verbalized. Will transport downstairs to the discharge lounge.

## 2023-05-25 NOTE — Plan of Care (Signed)
  Problem: Education: Goal: Ability to describe self-care measures that may prevent or decrease complications (Diabetes Survival Skills Education) will improve Outcome: Progressing   Problem: Coping: Goal: Ability to adjust to condition or change in health will improve Outcome: Progressing   Problem: Fluid Volume: Goal: Ability to maintain a balanced intake and output will improve Outcome: Progressing   Problem: Health Behavior/Discharge Planning: Goal: Ability to identify and utilize available resources and services will improve Outcome: Progressing Goal: Ability to manage health-related needs will improve Outcome: Progressing   Problem: Metabolic: Goal: Ability to maintain appropriate glucose levels will improve Outcome: Progressing   Problem: Nutritional: Goal: Maintenance of adequate nutrition will improve Outcome: Progressing Goal: Progress toward achieving an optimal weight will improve Outcome: Progressing   Problem: Skin Integrity: Goal: Risk for impaired skin integrity will decrease Outcome: Progressing   Problem: Tissue Perfusion: Goal: Adequacy of tissue perfusion will improve Outcome: Progressing   Problem: Activity: Goal: Ability to tolerate increased activity will improve Outcome: Progressing   Problem: Clinical Measurements: Goal: Ability to maintain clinical measurements within normal limits will improve Outcome: Progressing Goal: Will remain free from infection Outcome: Progressing Goal: Diagnostic test results will improve Outcome: Progressing Goal: Respiratory complications will improve Outcome: Progressing Goal: Cardiovascular complication will be avoided Outcome: Progressing   Problem: Activity: Goal: Risk for activity intolerance will decrease Outcome: Progressing   Problem: Elimination: Goal: Will not experience complications related to bowel motility Outcome: Progressing Goal: Will not experience complications related to urinary  retention Outcome: Progressing   Problem: Safety: Goal: Ability to remain free from injury will improve Outcome: Progressing   Problem: Education: Goal: Knowledge of disease or condition will improve Outcome: Progressing Goal: Knowledge of the prescribed therapeutic regimen will improve Outcome: Progressing Goal: Individualized Educational Video(s) Outcome: Progressing

## 2023-05-25 NOTE — Plan of Care (Signed)
 Problem: Education: Goal: Ability to describe self-care measures that may prevent or decrease complications (Diabetes Survival Skills Education) will improve Outcome: Adequate for Discharge Goal: Individualized Educational Video(s) Outcome: Adequate for Discharge   Problem: Coping: Goal: Ability to adjust to condition or change in health will improve Outcome: Adequate for Discharge   Problem: Fluid Volume: Goal: Ability to maintain a balanced intake and output will improve Outcome: Adequate for Discharge   Problem: Health Behavior/Discharge Planning: Goal: Ability to identify and utilize available resources and services will improve Outcome: Adequate for Discharge Goal: Ability to manage health-related needs will improve Outcome: Adequate for Discharge   Problem: Metabolic: Goal: Ability to maintain appropriate glucose levels will improve Outcome: Adequate for Discharge   Problem: Nutritional: Goal: Maintenance of adequate nutrition will improve Outcome: Adequate for Discharge Goal: Progress toward achieving an optimal weight will improve Outcome: Adequate for Discharge   Problem: Skin Integrity: Goal: Risk for impaired skin integrity will decrease Outcome: Adequate for Discharge   Problem: Tissue Perfusion: Goal: Adequacy of tissue perfusion will improve Outcome: Adequate for Discharge   Problem: Activity: Goal: Ability to tolerate increased activity will improve Outcome: Adequate for Discharge   Problem: Respiratory: Goal: Ability to maintain a clear airway and adequate ventilation will improve Outcome: Adequate for Discharge   Problem: Role Relationship: Goal: Method of communication will improve Outcome: Adequate for Discharge   Problem: Education: Goal: Knowledge of General Education information will improve Description: Including pain rating scale, medication(s)/side effects and non-pharmacologic comfort measures Outcome: Adequate for Discharge    Problem: Health Behavior/Discharge Planning: Goal: Ability to manage health-related needs will improve Outcome: Adequate for Discharge   Problem: Clinical Measurements: Goal: Ability to maintain clinical measurements within normal limits will improve Outcome: Adequate for Discharge Goal: Will remain free from infection Outcome: Adequate for Discharge Goal: Diagnostic test results will improve Outcome: Adequate for Discharge Goal: Respiratory complications will improve Outcome: Adequate for Discharge Goal: Cardiovascular complication will be avoided Outcome: Adequate for Discharge   Problem: Activity: Goal: Risk for activity intolerance will decrease Outcome: Adequate for Discharge   Problem: Nutrition: Goal: Adequate nutrition will be maintained Outcome: Adequate for Discharge   Problem: Coping: Goal: Level of anxiety will decrease Outcome: Adequate for Discharge   Problem: Elimination: Goal: Will not experience complications related to bowel motility Outcome: Adequate for Discharge Goal: Will not experience complications related to urinary retention Outcome: Adequate for Discharge   Problem: Pain Managment: Goal: General experience of comfort will improve and/or be controlled Outcome: Adequate for Discharge   Problem: Safety: Goal: Ability to remain free from injury will improve Outcome: Adequate for Discharge   Problem: Skin Integrity: Goal: Risk for impaired skin integrity will decrease Outcome: Adequate for Discharge   Problem: Education: Goal: Knowledge of disease or condition will improve Outcome: Adequate for Discharge Goal: Knowledge of the prescribed therapeutic regimen will improve Outcome: Adequate for Discharge Goal: Individualized Educational Video(s) Outcome: Adequate for Discharge   Problem: Activity: Goal: Ability to tolerate increased activity will improve Outcome: Adequate for Discharge Goal: Will verbalize the importance of balancing  activity with adequate rest periods Outcome: Adequate for Discharge   Problem: Respiratory: Goal: Ability to maintain a clear airway will improve Outcome: Adequate for Discharge Goal: Levels of oxygenation will improve Outcome: Adequate for Discharge Goal: Ability to maintain adequate ventilation will improve Outcome: Adequate for Discharge   Problem: Safety: Goal: Non-violent Restraint(s) Outcome: Adequate for Discharge   Problem: Inadequate Intake (NI-2.1) Goal: Food and/or nutrient delivery Description: Individualized approach  for food/nutrient provision. Outcome: Adequate for Discharge   Problem: Acute Rehab PT Goals(only PT should resolve) Goal: Pt Will Go Supine/Side To Sit Outcome: Adequate for Discharge Goal: Pt Will Go Sit To Supine/Side Outcome: Adequate for Discharge Goal: Patient Will Transfer Sit To/From Stand Outcome: Adequate for Discharge Goal: Pt Will Ambulate Outcome: Adequate for Discharge

## 2023-09-07 ENCOUNTER — Telehealth: Payer: Self-pay | Admitting: *Deleted

## 2023-09-07 NOTE — Telephone Encounter (Signed)
 Not a Clinic patient.   Copied from CRM [id number]. Topic: Clinical - Medication Refill >> Sep 04, 2023  3:19 PM Suzette B wrote: Received call from [facility] [phone]: stating that a prescription needed to be refilled by provider for Pantoprazole  40 mg. Advised the patient received the medication through Dr. Eric Nunnery, provided the representative with the contact information

## 2023-09-30 LAB — COLOGUARD: COLOGUARD: NEGATIVE

## 2024-03-08 ENCOUNTER — Emergency Department (HOSPITAL_COMMUNITY)

## 2024-03-08 ENCOUNTER — Inpatient Hospital Stay (HOSPITAL_COMMUNITY)
Admission: EM | Admit: 2024-03-08 | Source: Home / Self Care | Attending: Internal Medicine | Admitting: Internal Medicine

## 2024-03-08 ENCOUNTER — Encounter (HOSPITAL_COMMUNITY): Payer: Self-pay | Admitting: Emergency Medicine

## 2024-03-08 ENCOUNTER — Other Ambulatory Visit: Payer: Self-pay

## 2024-03-08 DIAGNOSIS — R591 Generalized enlarged lymph nodes: Secondary | ICD-10-CM | POA: Diagnosis not present

## 2024-03-08 DIAGNOSIS — E876 Hypokalemia: Secondary | ICD-10-CM | POA: Diagnosis not present

## 2024-03-08 DIAGNOSIS — R6520 Severe sepsis without septic shock: Secondary | ICD-10-CM

## 2024-03-08 DIAGNOSIS — R161 Splenomegaly, not elsewhere classified: Secondary | ICD-10-CM

## 2024-03-08 DIAGNOSIS — L03116 Cellulitis of left lower limb: Secondary | ICD-10-CM | POA: Diagnosis present

## 2024-03-08 DIAGNOSIS — J9601 Acute respiratory failure with hypoxia: Secondary | ICD-10-CM | POA: Diagnosis not present

## 2024-03-08 DIAGNOSIS — M6282 Rhabdomyolysis: Secondary | ICD-10-CM

## 2024-03-08 DIAGNOSIS — Z6841 Body Mass Index (BMI) 40.0 and over, adult: Secondary | ICD-10-CM

## 2024-03-08 DIAGNOSIS — B954 Other streptococcus as the cause of diseases classified elsewhere: Secondary | ICD-10-CM

## 2024-03-08 DIAGNOSIS — M4802 Spinal stenosis, cervical region: Secondary | ICD-10-CM

## 2024-03-08 DIAGNOSIS — I890 Lymphedema, not elsewhere classified: Secondary | ICD-10-CM

## 2024-03-08 DIAGNOSIS — J449 Chronic obstructive pulmonary disease, unspecified: Secondary | ICD-10-CM | POA: Diagnosis not present

## 2024-03-08 DIAGNOSIS — J9600 Acute respiratory failure, unspecified whether with hypoxia or hypercapnia: Secondary | ICD-10-CM | POA: Diagnosis present

## 2024-03-08 DIAGNOSIS — A419 Sepsis, unspecified organism: Secondary | ICD-10-CM | POA: Diagnosis present

## 2024-03-08 DIAGNOSIS — E860 Dehydration: Secondary | ICD-10-CM

## 2024-03-08 DIAGNOSIS — G9341 Metabolic encephalopathy: Secondary | ICD-10-CM | POA: Diagnosis present

## 2024-03-08 DIAGNOSIS — R4182 Altered mental status, unspecified: Principal | ICD-10-CM

## 2024-03-08 LAB — CBC WITH DIFFERENTIAL/PLATELET
Abs Immature Granulocytes: 0.1 10*3/uL — ABNORMAL HIGH (ref 0.00–0.07)
Basophils Absolute: 0 10*3/uL (ref 0.0–0.1)
Basophils Relative: 0 %
Eosinophils Absolute: 0 10*3/uL (ref 0.0–0.5)
Eosinophils Relative: 0 %
HCT: 41.1 % (ref 39.0–52.0)
Hemoglobin: 12.1 g/dL — ABNORMAL LOW (ref 13.0–17.0)
Immature Granulocytes: 1 %
Lymphocytes Relative: 5 %
Lymphs Abs: 0.7 10*3/uL (ref 0.7–4.0)
MCH: 24.6 pg — ABNORMAL LOW (ref 26.0–34.0)
MCHC: 29.4 g/dL — ABNORMAL LOW (ref 30.0–36.0)
MCV: 83.5 fL (ref 80.0–100.0)
Monocytes Absolute: 0.6 10*3/uL (ref 0.1–1.0)
Monocytes Relative: 4 %
Neutro Abs: 13.6 10*3/uL — ABNORMAL HIGH (ref 1.7–7.7)
Neutrophils Relative %: 90 %
Platelets: 163 10*3/uL (ref 150–400)
RBC: 4.92 MIL/uL (ref 4.22–5.81)
RDW: 18.3 % — ABNORMAL HIGH (ref 11.5–15.5)
WBC: 15.1 10*3/uL — ABNORMAL HIGH (ref 4.0–10.5)
nRBC: 0 % (ref 0.0–0.2)

## 2024-03-08 LAB — URINALYSIS, W/ REFLEX TO CULTURE (INFECTION SUSPECTED)
Bilirubin Urine: NEGATIVE
Glucose, UA: NEGATIVE mg/dL
Ketones, ur: NEGATIVE mg/dL
Leukocytes,Ua: NEGATIVE
Nitrite: NEGATIVE
Protein, ur: >=300 mg/dL — AB
Specific Gravity, Urine: 1.024 (ref 1.005–1.030)
pH: 6 (ref 5.0–8.0)

## 2024-03-08 LAB — URINE DRUG SCREEN
Amphetamines: NEGATIVE
Barbiturates: NEGATIVE
Benzodiazepines: NEGATIVE
Cocaine: NEGATIVE
Fentanyl: NEGATIVE
Methadone Scn, Ur: POSITIVE — AB
Opiates: NEGATIVE
Tetrahydrocannabinol: NEGATIVE

## 2024-03-08 LAB — LACTIC ACID, PLASMA
Lactic Acid, Venous: 2.5 mmol/L (ref 0.5–1.9)
Lactic Acid, Venous: 1.5 mmol/L (ref 0.5–1.9)

## 2024-03-08 LAB — BLOOD GAS, VENOUS
Acid-Base Excess: 9.6 mmol/L — ABNORMAL HIGH (ref 0.0–2.0)
Bicarbonate: 35.8 mmol/L — ABNORMAL HIGH (ref 20.0–28.0)
Drawn by: 51797
O2 Saturation: 50.2 %
Patient temperature: 39.2
pCO2, Ven: 59 mmHg (ref 44–60)
pH, Ven: 7.4 (ref 7.25–7.43)
pO2, Ven: 33 mmHg (ref 32–45)

## 2024-03-08 LAB — COMPREHENSIVE METABOLIC PANEL WITH GFR
ALT: 18 U/L (ref 0–44)
AST: 57 U/L — ABNORMAL HIGH (ref 15–41)
Albumin: 3.7 g/dL (ref 3.5–5.0)
Alkaline Phosphatase: 82 U/L (ref 38–126)
Anion gap: 17 — ABNORMAL HIGH (ref 5–15)
BUN: 19 mg/dL (ref 6–20)
CO2: 28 mmol/L (ref 22–32)
Calcium: 8.6 mg/dL — ABNORMAL LOW (ref 8.9–10.3)
Chloride: 88 mmol/L — ABNORMAL LOW (ref 98–111)
Creatinine, Ser: 1.13 mg/dL (ref 0.61–1.24)
GFR, Estimated: >60 mL/min
Glucose, Bld: 148 mg/dL — ABNORMAL HIGH (ref 70–99)
Potassium: 2.6 mmol/L — CL (ref 3.5–5.1)
Sodium: 133 mmol/L — ABNORMAL LOW (ref 135–145)
Total Bilirubin: 1.1 mg/dL (ref 0.0–1.2)
Total Protein: 7.2 g/dL (ref 6.5–8.1)

## 2024-03-08 LAB — TROPONIN T, HIGH SENSITIVITY
Troponin T High Sensitivity: 68 ng/L — ABNORMAL HIGH (ref 0–19)
Troponin T High Sensitivity: 63 ng/L — ABNORMAL HIGH (ref 0–19)

## 2024-03-08 LAB — PROTIME-INR
INR: 1.1 (ref 0.8–1.2)
Prothrombin Time: 15.2 s (ref 11.4–15.2)

## 2024-03-08 LAB — RESP PANEL BY RT-PCR (RSV, FLU A&B, COVID)  RVPGX2
Influenza A by PCR: NEGATIVE
Influenza B by PCR: NEGATIVE
Resp Syncytial Virus by PCR: NEGATIVE
SARS Coronavirus 2 by RT PCR: NEGATIVE

## 2024-03-08 LAB — CK: Total CK: 2072 U/L — ABNORMAL HIGH (ref 49–397)

## 2024-03-08 LAB — CBG MONITORING, ED: Glucose-Capillary: 123 mg/dL — ABNORMAL HIGH (ref 70–99)

## 2024-03-08 LAB — MAGNESIUM: Magnesium: 2 mg/dL (ref 1.7–2.4)

## 2024-03-08 MED ORDER — LACTATED RINGERS IV BOLUS (SEPSIS)
1000.0000 mL | Freq: Once | INTRAVENOUS | Status: AC
  Administered 2024-03-08: 1000 mL via INTRAVENOUS

## 2024-03-08 MED ORDER — ACETAMINOPHEN 650 MG RE SUPP: 650.0000 mg | Freq: Four times a day (QID) | RECTAL | Status: AC | PRN

## 2024-03-08 MED ORDER — IPRATROPIUM-ALBUTEROL 0.5-2.5 (3) MG/3ML IN SOLN: 3.0000 mL | RESPIRATORY_TRACT | Status: AC | PRN

## 2024-03-08 MED ORDER — IOHEXOL 300 MG/ML  SOLN
100.0000 mL | Freq: Once | INTRAMUSCULAR | Status: AC | PRN
  Administered 2024-03-08: 100 mL via INTRAVENOUS

## 2024-03-08 MED ORDER — POLYETHYLENE GLYCOL 3350 17 G PO PACK: 17.0000 g | PACK | Freq: Every day | ORAL | Status: AC | PRN

## 2024-03-08 MED ORDER — FLUTICASONE FUROATE-VILANTEROL 200-25 MCG/ACT IN AEPB
1.0000 | INHALATION_SPRAY | Freq: Every day | RESPIRATORY_TRACT | Status: AC
  Administered 2024-03-10 – 2024-03-11 (×2): 1 via RESPIRATORY_TRACT
  Filled 2024-03-08: qty 28

## 2024-03-08 MED ORDER — VANCOMYCIN HCL 2000 MG/400ML IV SOLN
2000.0000 mg | Freq: Once | INTRAVENOUS | Status: AC
  Administered 2024-03-08: 2000 mg via INTRAVENOUS
  Filled 2024-03-08: qty 400

## 2024-03-08 MED ORDER — ACETAMINOPHEN 650 MG RE SUPP
650.0000 mg | Freq: Once | RECTAL | Status: AC
  Administered 2024-03-08: 650 mg via RECTAL
  Filled 2024-03-08: qty 1

## 2024-03-08 MED ORDER — NALOXONE HCL 0.4 MG/ML IJ SOLN
0.4000 mg | Freq: Once | INTRAMUSCULAR | Status: AC
  Administered 2024-03-08: 0.4 mg via INTRAVENOUS
  Filled 2024-03-08: qty 1

## 2024-03-08 MED ORDER — SODIUM CHLORIDE 0.9 % IV SOLN
2.0000 g | INTRAVENOUS | Status: DC
  Administered 2024-03-09: 2 g via INTRAVENOUS
  Filled 2024-03-08: qty 20

## 2024-03-08 MED ORDER — ACETAMINOPHEN 325 MG PO TABS
650.0000 mg | ORAL_TABLET | Freq: Four times a day (QID) | ORAL | Status: AC | PRN
  Administered 2024-03-09 – 2024-03-11 (×6): 650 mg via ORAL
  Filled 2024-03-08 (×6): qty 2

## 2024-03-08 MED ORDER — ASPIRIN 300 MG RE SUPP
300.0000 mg | Freq: Once | RECTAL | Status: AC
  Administered 2024-03-08: 300 mg via RECTAL
  Filled 2024-03-08: qty 1

## 2024-03-08 MED ORDER — NALOXONE HCL 4 MG/10ML IJ SOLN
0.2500 mg/h | INTRAVENOUS | Status: DC
  Administered 2024-03-08: 0.25 mg/h via INTRAVENOUS
  Filled 2024-03-08: qty 10

## 2024-03-08 MED ORDER — ONDANSETRON HCL 4 MG PO TABS: 4.0000 mg | ORAL_TABLET | Freq: Four times a day (QID) | ORAL | Status: AC | PRN

## 2024-03-08 MED ORDER — SODIUM CHLORIDE 0.9 % IV SOLN
2.0000 g | Freq: Once | INTRAVENOUS | Status: AC
  Administered 2024-03-08: 2 g via INTRAVENOUS
  Filled 2024-03-08: qty 12.5

## 2024-03-08 MED ORDER — VANCOMYCIN HCL 1250 MG/250ML IV SOLN
1250.0000 mg | Freq: Two times a day (BID) | INTRAVENOUS | Status: DC
  Administered 2024-03-09 – 2024-03-10 (×3): 1250 mg via INTRAVENOUS
  Filled 2024-03-08 (×3): qty 250

## 2024-03-08 MED ORDER — ENOXAPARIN SODIUM 40 MG/0.4ML IJ SOSY
40.0000 mg | PREFILLED_SYRINGE | INTRAMUSCULAR | Status: AC
  Administered 2024-03-09 – 2024-03-11 (×3): 40 mg via SUBCUTANEOUS
  Filled 2024-03-08 (×3): qty 0.4

## 2024-03-08 MED ORDER — POTASSIUM CHLORIDE 10 MEQ/100ML IV SOLN
10.0000 meq | INTRAVENOUS | Status: AC
  Administered 2024-03-08 (×3): 10 meq via INTRAVENOUS
  Filled 2024-03-08 (×3): qty 100

## 2024-03-08 MED ORDER — ONDANSETRON HCL 4 MG/2ML IJ SOLN: 4.0000 mg | Freq: Four times a day (QID) | INTRAMUSCULAR | Status: AC | PRN

## 2024-03-08 MED ORDER — POTASSIUM CHLORIDE IN NACL 40-0.9 MEQ/L-% IV SOLN
INTRAVENOUS | Status: AC
  Filled 2024-03-08 (×2): qty 1000

## 2024-03-08 MED ORDER — VANCOMYCIN HCL IN DEXTROSE 1-5 GM/200ML-% IV SOLN
1000.0000 mg | Freq: Once | INTRAVENOUS | Status: DC
  Filled 2024-03-08: qty 200

## 2024-03-08 MED ORDER — LACTATED RINGERS IV SOLN: INTRAVENOUS | Status: DC
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
## 2024-03-08 NOTE — ED Triage Notes (Signed)
 Pt bib [facility] from home. Pt found by brother laying face down on the floor. Unknown time of how long pt had been laying their. Pt did hit his head. Unknown if he takes blood thinners. Pt sats were 79 % on RA. Pt placed on NRN at 15L. Sats now in 90s. Axillary temp 102.1 w/ EMS. Pt also has staph infection to both legs.

## 2024-03-08 NOTE — ED Notes (Signed)
 Pt is awake and asking for water- primary RN aware.

## 2024-03-08 NOTE — Sepsis Progress Note (Signed)
 Sepsis protocol monitored by eLink

## 2024-03-08 NOTE — Consult Note (Signed)
 Pharmacy Antibiotic Note  Nathaniel [name] is a 54 y.o. male admitted on 03/08/2024 with altered mental status and possible cellulitis.  Pharmacy has been consulted for Vancomycin  dosing.  Plan: Vancomycin  2000 mg IV x 1 as loading dose, followed by: 1250 mg IV Q 12 hrs. Goal AUC 400-550. Expected AUC: 470.6 Expected Cmin: 13.7 SCr used: 1.13, Vd used: 0.5   Height: 6' (182.9 cm) Weight: (!) 145 kg (319 lb 10.7 oz) IBW/kg (Calculated) : 77.6  Temp (24hrs), Avg:102 F (38.9 C), Min:100.2 F (37.9 C), Max:103 F (39.4 C)  Recent Labs  Lab 03/08/24 1240 03/08/24 1551  WBC 15.1*  --   CREATININE 1.13  --   LATICACIDVEN 2.5* 1.5    Estimated Creatinine Clearance: 111.9 mL/min (by C-G formula based on SCr of 1.13 mg/dL).    Allergies[1]  Antimicrobials this admission: Vancomycin  2/3 >>  Cefepime  2/3 x 1 Ceftriaxone  2/4 >>   Dose adjustments this admission: N/A  Microbiology results: 2/3 BCx: collected 2/3 UCx: ordered    Thank you for allowing pharmacy to be a part of this patients care.  Netta Fodge A Fay Swider 03/08/2024 9:41 PM     [1]  Allergies Allergen Reactions   Ibuprofen Nausea Only

## 2024-03-08 NOTE — ED Provider Notes (Signed)
 Pt signed out by Dr. Cecilia pending CT scan results.   CT scan reviewed by me.  I agree with the radiologist.  CT head:  No acute intracranial abnormality.  2. Chronic bilateral nasal bone fractures.   CT cervical spine:  No evidence of acute traumatic injury.  2. 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 Consider nonemergent MRI of the  cervical spine for further evaluation if radicular symptoms are present.   CT l-spine: 1. No acute thoracic or lumbar spine fractures.  2. Mild diffuse thoracic spondylosis without significant compressive  sequela.  3. Multilevel lumbar degenerative changes, greatest at L5-S1.  4. Bilateral L5 spondylolysis without spondylolisthesis.   CT T-spine: No acute thoracic or lumbar spine fractures.  2. Mild diffuse thoracic spondylosis without significant compressive  sequela.  3. Multilevel lumbar degenerative changes, greatest at L5-S1.  4. Bilateral L5 spondylolysis without spondylolisthesis.   CT chest/abd/pelvis: No evidence of acute intrathoracic, intra-abdominal, or  intrapelvic trauma.  2. Splenomegaly, with left supraclavicular, retroperitoneal, and  left inguinal regions as above. Findings are concerning for  underlying lymphoproliferative disorder.  3. Cardiomegaly.  4.  Aortic Atherosclerosis (ICD10-I70.0).   CT L Foot/tib/fib/femur: Mild-to-moderate nonspecific circumferential skin thickening and  subcutaneous edema within the lower leg and distal thigh, suspicious  for cellulitis. No focal fluid collection, soft tissue emphysema or  unexpected foreign body identified.  2. No evidence of osteomyelitis or septic joint.  3. Prominent left inguinal lymph nodes, likely reactive in this  clinical context. Correlate clinically to exclude other etiologies.  4. Stable postsurgical changes in the proximal tibia with underlying  moderate tricompartmental degenerative  changes at the left knee.   Pt d/w Dr. Pearlean (triad ) for admission.    Dean Clarity, MD 03/08/24 [id number]

## 2024-03-09 ENCOUNTER — Inpatient Hospital Stay (HOSPITAL_COMMUNITY)

## 2024-03-09 DIAGNOSIS — A419 Sepsis, unspecified organism: Secondary | ICD-10-CM | POA: Diagnosis not present

## 2024-03-09 DIAGNOSIS — R7881 Bacteremia: Secondary | ICD-10-CM | POA: Diagnosis not present

## 2024-03-09 DIAGNOSIS — R6520 Severe sepsis without septic shock: Secondary | ICD-10-CM | POA: Diagnosis not present

## 2024-03-09 LAB — CBC
HCT: 37 % — ABNORMAL LOW (ref 39.0–52.0)
Hemoglobin: 10.8 g/dL — ABNORMAL LOW (ref 13.0–17.0)
MCH: 24.5 pg — ABNORMAL LOW (ref 26.0–34.0)
MCHC: 29.2 g/dL — ABNORMAL LOW (ref 30.0–36.0)
MCV: 83.9 fL (ref 80.0–100.0)
Platelets: 137 10*3/uL — ABNORMAL LOW (ref 150–400)
RBC: 4.41 MIL/uL (ref 4.22–5.81)
RDW: 18.6 % — ABNORMAL HIGH (ref 11.5–15.5)
WBC: 10.1 10*3/uL (ref 4.0–10.5)
nRBC: 0 % (ref 0.0–0.2)

## 2024-03-09 LAB — BLOOD CULTURE ID PANEL (REFLEXED) - BCID2

## 2024-03-09 LAB — ECHOCARDIOGRAM COMPLETE
AR max vel: 2.56 cm2
AV Area VTI: 3 cm2
AV Area mean vel: 2.73 cm2
AV Mean grad: 5 mmHg
AV Peak grad: 9.4 mmHg
Ao pk vel: 1.53 m/s
Area-P 1/2: 3.34 cm2
Calc EF: 68.7 %
Height: 72 in
S' Lateral: 4.3 cm
Single Plane A2C EF: 71.9 %
Single Plane A4C EF: 66.2 %
Weight: 5114.67 [oz_av]

## 2024-03-09 LAB — BASIC METABOLIC PANEL WITH GFR
Anion gap: 10 (ref 5–15)
BUN: 19 mg/dL (ref 6–20)
CO2: 32 mmol/L (ref 22–32)
Calcium: 7.8 mg/dL — ABNORMAL LOW (ref 8.9–10.3)
Chloride: 94 mmol/L — ABNORMAL LOW (ref 98–111)
Creatinine, Ser: 0.85 mg/dL (ref 0.61–1.24)
GFR, Estimated: >60 mL/min
Glucose, Bld: 119 mg/dL — ABNORMAL HIGH (ref 70–99)
Potassium: 3.1 mmol/L — ABNORMAL LOW (ref 3.5–5.1)
Sodium: 136 mmol/L (ref 135–145)

## 2024-03-09 LAB — GLUCOSE, CAPILLARY
Glucose-Capillary: 134 mg/dL — ABNORMAL HIGH (ref 70–99)
Glucose-Capillary: 143 mg/dL — ABNORMAL HIGH (ref 70–99)

## 2024-03-09 LAB — MRSA NEXT GEN BY PCR, NASAL: MRSA by PCR Next Gen: DETECTED — AB

## 2024-03-09 MED ORDER — MUPIROCIN 2 % EX OINT
TOPICAL_OINTMENT | Freq: Two times a day (BID) | CUTANEOUS | Status: AC
  Filled 2024-03-09: qty 22

## 2024-03-09 MED ORDER — PERFLUTREN LIPID MICROSPHERE
1.0000 mL | INTRAVENOUS | Status: AC | PRN
  Administered 2024-03-09: 2 mL via INTRAVENOUS

## 2024-03-09 MED ORDER — POTASSIUM CHLORIDE CRYS ER 20 MEQ PO TBCR
40.0000 meq | EXTENDED_RELEASE_TABLET | Freq: Once | ORAL | Status: AC
  Administered 2024-03-09: 40 meq via ORAL
  Filled 2024-03-09: qty 2

## 2024-03-09 MED ORDER — CHLORHEXIDINE GLUCONATE CLOTH 2 % EX PADS
6.0000 | MEDICATED_PAD | Freq: Every day | CUTANEOUS | Status: AC
  Administered 2024-03-09 – 2024-03-11 (×3): 6 via TOPICAL

## 2024-03-09 NOTE — Progress Notes (Signed)
 " PROGRESS NOTE    ABHIJOT STRAUGHTER  FMW:[id number] DOB: Dec 07, 1970 DOA: 03/08/2024 PCP: Freddrick, No    Brief Narrative:  Nathaniel [name] is a 54 y.o. male with medical history significant for COPD, morbid obesity, heroin use disorder.  Patient was brought to the ED via EMS for reports of altered mental status.  Patient was found lying facedown on the floor by his brother.  It is unknown how long patient had been on the floor.  Found to have a lower extremity cellulitis.     Assessment and Plan: Severe sepsis 2/2 left lower extremity cellulitis-meeting severe sepsis criteria with tachycardia heart rate 91-112, leukocytosis of 15.1, with evidence of endorgan dysfunction hypoxia, encephalopathy and lactic acidosis of 2.5 >> 1.5.   -Etiology likely left lower extremity cellulitis - Baseline chronic lymphedema with induration.   -CT left lower extremity-suggest cellulitis, no fluid collection or foreign body identified.  - IV vancomycin  and ceftriaxone  - Follow-up blood cultures- gram + cocci -monitor in SDU -check echo  Rhabdo -trend CK -IVF   Acute hypoxic respiratory failure-O2 sats down to 79% on room air, initially placed on 15 L nonrebreather then switched to nasal cannula.  CT chest with contrast without acute chest abnormality.  Patient's responded to 0.4 mg of Narcan .  History of heroin use.  On methadone . -wean O2 as able -IS   Acute metabolic encephalopathy - Likely secondary to severe sepsis, cellulitis, hypoxia, and possibly narcotic use.  Patient found facedown on the floor by his brother.  Head CT negative for acute abnormality.  Cervical spine CT shows significant multilevel foraminal stenosis nonemergent MRI recommended, if radicular symptoms present. - IV antibiotics, hydrate -improved so will d/c narcan  gtt   Hypokalemia, hyponatremia-potassium 2.6.  Sodium 133. - replete   COPD-no wheezing on exam. - DuoNebs as needed   H/o Heroin abuse   Lymphadenopathy-CT findings chest  abdomen pelvis with contrast-  Splenomegaly, with left supraclavicular, retroperitoneal, and left inguinal regions as above. Findings are concerning for underlying lymphoproliferative disorder. - Follow-up as outpatient   Severe obesity Estimated body mass index is 43.35 kg/m as calculated from the following:   Height as of this encounter: 6' (1.829 m).   Weight as of this encounter: 145 kg.     Foley placed in ER-- ? Reason?  DVT prophylaxis: enoxaparin  (LOVENOX ) injection 40 mg Start: 03/08/24 2130    Code Status: Full Code Family Communication:   Disposition Plan:  Level of care: Stepdown Status is: Inpatient     Consultants:    Subjective: Hungry-- would like to eat  Objective: Vitals:   03/09/24 0600 03/09/24 0745 03/09/24 0800 03/09/24 0830  BP: (!) 147/60 (!) 141/99 (!) 176/80 135/72  Pulse: 74 84 88 83  Resp: 20 (!) 24 (!) 22 19  Temp: 99.1 F (37.3 C) 99.1 F (37.3 C) 99.1 F (37.3 C) 99.2 F (37.3 C)  TempSrc: Bladder     SpO2: 99% 95%  98%  Weight:      Height:        Intake/Output Summary (Last 24 hours) at 03/09/2024 1116 Last data filed at 03/09/2024 1058 Gross per 24 hour  Intake 2369.73 ml  Output 1000 ml  Net 1369.73 ml   Filed Weights   03/08/24 1228  Weight: (!) 145 kg    Examination:   General: Appearance:    Severely obese male in no acute distress     Lungs:     On , respirations unlabored  Heart:  Normal heart rate.    MS:   All extremities are intact.    Neurologic:   Awake, alert       Data Reviewed: I have personally reviewed following labs and imaging studies  CBC: Recent Labs  Lab 03/08/24 1240 03/09/24 0245  WBC 15.1* 10.1  NEUTROABS 13.6*  --   HGB 12.1* 10.8*  HCT 41.1 37.0*  MCV 83.5 83.9  PLT 163 137*   Basic Metabolic Panel: Recent Labs  Lab 03/08/24 1240 03/08/24 1551 03/09/24 0245  NA 133*  --  136  K 2.6*  --  3.1*  CL 88*  --  94*  CO2 28  --  32  GLUCOSE 148*  --  119*  BUN 19  --   19  CREATININE 1.13  --  0.85  CALCIUM 8.6*  --  7.8*  MG  --  2.0  --    GFR: Estimated Creatinine Clearance: 148.7 mL/min (by C-G formula based on SCr of 0.85 mg/dL). Liver Function Tests: Recent Labs  Lab 03/08/24 1240  AST 57*  ALT 18  ALKPHOS 82  BILITOT 1.1  PROT 7.2  ALBUMIN 3.7   No results for input(s): LIPASE, AMYLASE in the last 168 hours. No results for input(s): AMMONIA in the last 168 hours. Coagulation Profile: Recent Labs  Lab 03/08/24 1240  INR 1.1   Cardiac Enzymes: Recent Labs  Lab 03/08/24 1551  CKTOTAL 2,072*   BNP (last 3 results) No results for input(s): PROBNP in the last 8760 hours. HbA1C: No results for input(s): HGBA1C in the last 72 hours. CBG: Recent Labs  Lab 03/08/24 1355  GLUCAP 123*   Lipid Profile: No results for input(s): CHOL, HDL, LDLCALC, TRIG, CHOLHDL, LDLDIRECT in the last 72 hours. Thyroid Function Tests: No results for input(s): TSH, T4TOTAL, FREET4, T3FREE, THYROIDAB in the last 72 hours. Anemia Panel: No results for input(s): VITAMINB12, FOLATE, FERRITIN, TIBC, IRON, RETICCTPCT in the last 72 hours. Sepsis Labs: Recent Labs  Lab 03/08/24 1240 03/08/24 1551  LATICACIDVEN 2.5* 1.5    Recent Results (from the past 240 hours)  Blood culture (routine x 2)     Status: None (Preliminary result)   Collection Time: 03/08/24 12:40 PM   Specimen: BLOOD  Result Value Ref Range Status   Specimen Description   Final    BLOOD BLOOD RIGHT ARM Performed at [facility], [address], [place], [place] [address]    Special Requests   Final    Blood Culture adequate volume BOTTLES DRAWN AEROBIC AND ANAEROBIC Performed at [facility], [address] [place], [place] [address]    Culture  Setup Time   Final    GRAM POSITIVE COCCI IN PAIRS AND CHAINS IN BOTH AEROBIC AND ANAEROBIC BOTTLES CRITICAL VALUE NOTED.  VALUE IS CONSISTENT WITH PREVIOUSLY REPORTED AND CALLED  VALUE. Performed at [facility], [address] [place], [place] [address]    Culture GRAM POSITIVE COCCI  Final   Report Status PENDING  Incomplete  Blood culture (routine x 2)     Status: None (Preliminary result)   Collection Time: 03/08/24 12:41 PM   Specimen: BLOOD  Result Value Ref Range Status   Specimen Description   Final    BLOOD BLOOD LEFT ARM Performed at [facility], [address] [place], [place] [address]    Special Requests   Final    BOTTLES DRAWN AEROBIC AND ANAEROBIC Blood Culture adequate volume Performed at [facility], [address]  [place], [place] [address]    Culture  Setup Time   Final    GRAM POSITIVE COCCI IN PAIRS AND CHAINS IN BOTH AEROBIC AND ANAEROBIC BOTTLES Gram Stain Report Called to,Read Back By and Verified With: R THOMPSON,RN@0015  03/09/24 MK CRITICAL RESULT CALLED TO, READ BACK BY AND VERIFIED WITH: COURTNEY P FLOOR RN  03/09/2024 BY DD @ 0505 Performed at [facility], [address] [place], [place] [address]    Culture GRAM POSITIVE COCCI  Final   Report Status PENDING  Incomplete  Blood Culture ID Panel (Reflexed)     Status: Abnormal   Collection Time: 03/08/24 12:41 PM  Result Value Ref Range Status   Enterococcus faecalis NOT DETECTED NOT DETECTED Final   Enterococcus Faecium NOT DETECTED NOT DETECTED Final   Listeria monocytogenes NOT DETECTED NOT DETECTED Final   Staphylococcus species DETECTED (A) NOT DETECTED Final    Comment: CRITICAL RESULT CALLED TO, READ BACK BY AND VERIFIED WITH: COURTNEY P FLOOR RN  03/09/2024 BY DD @ 0505    Staphylococcus aureus (BCID) NOT DETECTED NOT DETECTED Final   Staphylococcus epidermidis DETECTED (A) NOT DETECTED Final    Comment: Methicillin (oxacillin) resistant coagulase negative staphylococcus. Possible blood culture contaminant (unless isolated from more than one blood culture draw or clinical case suggests pathogenicity). No antibiotic treatment is indicated for blood   culture contaminants. CRITICAL RESULT CALLED TO, READ BACK BY AND VERIFIED WITH: COURTNEY P FLOOR RN  03/09/2024 BY DD @ 0505    Staphylococcus lugdunensis NOT DETECTED NOT DETECTED Final   Streptococcus species DETECTED (A) NOT DETECTED Final    Comment: Not Enterococcus species, Streptococcus agalactiae, Streptococcus pyogenes, or Streptococcus pneumoniae. CRITICAL RESULT CALLED TO, READ BACK BY AND VERIFIED WITH: COURTNEY P FLOOR RN  03/09/2024 BY DD @ 0505    Streptococcus agalactiae NOT DETECTED NOT DETECTED Final   Streptococcus pneumoniae NOT DETECTED NOT DETECTED Final   Streptococcus pyogenes NOT DETECTED NOT DETECTED Final   A.calcoaceticus-baumannii NOT DETECTED NOT DETECTED Final   Bacteroides fragilis NOT DETECTED NOT DETECTED Final   Enterobacterales NOT DETECTED NOT DETECTED Final   Enterobacter cloacae complex NOT DETECTED NOT DETECTED Final   Escherichia coli NOT DETECTED NOT DETECTED Final   Klebsiella aerogenes NOT DETECTED NOT DETECTED Final   Klebsiella oxytoca NOT DETECTED NOT DETECTED Final   Klebsiella pneumoniae NOT DETECTED NOT DETECTED Final   Proteus species NOT DETECTED NOT DETECTED Final   Salmonella species NOT DETECTED NOT DETECTED Final   Serratia marcescens NOT DETECTED NOT DETECTED Final   Haemophilus influenzae NOT DETECTED NOT DETECTED Final   Neisseria meningitidis NOT DETECTED NOT DETECTED Final   Pseudomonas aeruginosa NOT DETECTED NOT DETECTED Final   Stenotrophomonas maltophilia NOT DETECTED NOT DETECTED Final   Candida albicans NOT DETECTED NOT DETECTED Final   Candida auris NOT DETECTED NOT DETECTED Final   Candida glabrata NOT DETECTED NOT DETECTED Final   Candida krusei NOT DETECTED NOT DETECTED Final   Candida parapsilosis NOT DETECTED NOT DETECTED Final   Candida tropicalis NOT DETECTED NOT DETECTED Final   Cryptococcus neoformans/gattii NOT DETECTED NOT DETECTED Final   Methicillin resistance mecA/C DETECTED (A) NOT DETECTED Final     Comment: CRITICAL RESULT CALLED TO, READ BACK BY AND VERIFIED WITH: COURTNEY P FLOOR RN  03/09/2024 BY DD @ 0505 Performed at [facility], [address] [place], [place] [address]   Resp panel by RT-PCR (RSV, Flu A&B, Covid) Anterior Nasal Swab     Status:  None   Collection Time: 03/08/24  1:56 PM   Specimen: Anterior Nasal Swab  Result Value Ref Range Status   SARS Coronavirus 2 by RT PCR NEGATIVE NEGATIVE Final    Comment: (NOTE) SARS-CoV-2 target nucleic acids are NOT DETECTED.  The SARS-CoV-2 RNA is generally detectable in upper respiratory specimens during the acute phase of infection. The lowest concentration of SARS-CoV-2 viral copies this assay can detect is 138 copies/mL. A negative result does not preclude SARS-Cov-2 infection and should not be used as the sole basis for treatment or other patient management decisions. A negative result may occur with  improper specimen collection/handling, submission of specimen other than nasopharyngeal swab, presence of viral mutation(s) within the areas targeted by this assay, and inadequate number of viral copies(<138 copies/mL). A negative result must be combined with clinical observations, patient history, and epidemiological information. The expected result is Negative.  Fact Sheet for Patients:  bloggercourse.com  Fact Sheet for Healthcare Providers:  seriousbroker.it  This test is no t yet approved or cleared by the [place]  FDA and  has been authorized for detection and/or diagnosis of SARS-CoV-2 by FDA under an Emergency Use Authorization (EUA). This EUA will remain  in effect (meaning this test can be used) for the duration of the COVID-19 declaration under Section 564(b)(1) of the Act, 21 U.S.C.section 360bbb-3(b)(1), unless the authorization is terminated  or revoked sooner.       Influenza A by PCR NEGATIVE NEGATIVE Final   Influenza B by PCR NEGATIVE  NEGATIVE Final    Comment: (NOTE) The Xpert Xpress SARS-CoV-2/FLU/RSV plus assay is intended as an aid in the diagnosis of influenza from Nasopharyngeal swab specimens and should not be used as a sole basis for treatment. Nasal washings and aspirates are unacceptable for Xpert Xpress SARS-CoV-2/FLU/RSV testing.  Fact Sheet for Patients: bloggercourse.com  Fact Sheet for Healthcare Providers: seriousbroker.it  This test is not yet approved or cleared by the [place]  FDA and has been authorized for detection and/or diagnosis of SARS-CoV-2 by FDA under an Emergency Use Authorization (EUA). This EUA will remain in effect (meaning this test can be used) for the duration of the COVID-19 declaration under Section 564(b)(1) of the Act, 21 U.S.C. section 360bbb-3(b)(1), unless the authorization is terminated or revoked.     Resp Syncytial Virus by PCR NEGATIVE NEGATIVE Final    Comment: (NOTE) Fact Sheet for Patients: bloggercourse.com  Fact Sheet for Healthcare Providers: seriousbroker.it  This test is not yet approved or cleared by the [place]  FDA and has been authorized for detection and/or diagnosis of SARS-CoV-2 by FDA under an Emergency Use Authorization (EUA). This EUA will remain in effect (meaning this test can be used) for the duration of the COVID-19 declaration under Section 564(b)(1) of the Act, 21 U.S.C. section 360bbb-3(b)(1), unless the authorization is terminated or revoked.  Performed at [facility], [address] [place], [place] [address]          Radiology Studies: CT FEMUR LEFT W CONTRAST Result Date: 03/08/2024 CLINICAL DATA:  Soft tissue infection suspected, lower leg, xray done. Lateral wound in the proximal lower leg with erythema extending into the distal thigh. EXAM: CT OF THE LOWER LEFT EXTREMITY WITH CONTRAST TECHNIQUE: Multidetector CT  imaging of the lower left extremity was performed according to the standard protocol following intravenous contrast administration. This examination is a large field of view survey with images extending from the level of the femoral head into the foot. Portions of the distal  forefoot are incompletely visualized. RADIATION DOSE REDUCTION: This exam was performed according to the departmental dose-optimization program which includes automated exposure control, adjustment of the mA and/or kV according to patient size and/or use of iterative reconstruction technique. CONTRAST:  OMNIPAQUE  IOHEXOL  300 MG/ML SOLN, 100mL OMNIPAQUE  IOHEXOL  300 MG/ML SOLN COMPARISON:  None recent.  Left knee radiographs 10/16/2007. FINDINGS: Bones/Joint/Cartilage No evidence of acute fracture, dislocation or osteomyelitis. Stable postsurgical changes in the proximal tibia with 2 laterally inserted screws in the tibial plateau. Underlying moderate tricompartmental degenerative changes at the left knee. There is a small knee joint effusion. No significant arthropathy or effusion at the left ankle. The left hip joint is incompletely visualized. Ligaments Suboptimally assessed by CT. Muscles and Tendons No intramuscular fluid collection, suspicious enhancement or focal atrophy demonstrated. The quadriceps and patellar tendons are intact. The ankle tendons appear intact as evaluated by CT, without significant tenosynovitis. Soft tissues Mild-to-moderate nonspecific circumferential skin thickening and subcutaneous edema within the lower leg and distal thigh. There is distal extension into the dorsal aspect of the foot. Possible mild skin ulceration anteriorly in the lower leg. No focal fluid collection, soft tissue emphysema or unexpected foreign body identified. No acute vascular findings are seen. Prominent left inguinal lymph nodes measuring up to 2.3 cm transverse are likely reactive in this clinical context. Incidentally noted are small  bilateral scrotal hydroceles and a Foley catheter. IMPRESSION: 1. Mild-to-moderate nonspecific circumferential skin thickening and subcutaneous edema within the lower leg and distal thigh, suspicious for cellulitis. No focal fluid collection, soft tissue emphysema or unexpected foreign body identified. 2. No evidence of osteomyelitis or septic joint. 3. Prominent left inguinal lymph nodes, likely reactive in this clinical context. Correlate clinically to exclude other etiologies. 4. Stable postsurgical changes in the proximal tibia with underlying moderate tricompartmental degenerative changes at the left knee. Electronically Signed   By: Elsie Perone M.D.   On: 03/08/2024 16:56   CT TIBIA FIBULA LEFT W CONTRAST Result Date: 03/08/2024 CLINICAL DATA:  Soft tissue infection suspected, lower leg, xray done. Lateral wound in the proximal lower leg with erythema extending into the distal thigh. EXAM: CT OF THE LOWER LEFT EXTREMITY WITH CONTRAST TECHNIQUE: Multidetector CT imaging of the lower left extremity was performed according to the standard protocol following intravenous contrast administration. This examination is a large field of view survey with images extending from the level of the femoral head into the foot. Portions of the distal forefoot are incompletely visualized. RADIATION DOSE REDUCTION: This exam was performed according to the departmental dose-optimization program which includes automated exposure control, adjustment of the mA and/or kV according to patient size and/or use of iterative reconstruction technique. CONTRAST:  OMNIPAQUE  IOHEXOL  300 MG/ML SOLN, 100mL OMNIPAQUE  IOHEXOL  300 MG/ML SOLN COMPARISON:  None recent.  Left knee radiographs 10/16/2007. FINDINGS: Bones/Joint/Cartilage No evidence of acute fracture, dislocation or osteomyelitis. Stable postsurgical changes in the proximal tibia with 2 laterally inserted screws in the tibial plateau. Underlying moderate tricompartmental  degenerative changes at the left knee. There is a small knee joint effusion. No significant arthropathy or effusion at the left ankle. The left hip joint is incompletely visualized. Ligaments Suboptimally assessed by CT. Muscles and Tendons No intramuscular fluid collection, suspicious enhancement or focal atrophy demonstrated. The quadriceps and patellar tendons are intact. The ankle tendons appear intact as evaluated by CT, without significant tenosynovitis. Soft tissues Mild-to-moderate nonspecific circumferential skin thickening and subcutaneous edema within the lower leg and distal thigh. There is distal extension  into the dorsal aspect of the foot. Possible mild skin ulceration anteriorly in the lower leg. No focal fluid collection, soft tissue emphysema or unexpected foreign body identified. No acute vascular findings are seen. Prominent left inguinal lymph nodes measuring up to 2.3 cm transverse are likely reactive in this clinical context. Incidentally noted are small bilateral scrotal hydroceles and a Foley catheter. IMPRESSION: 1. Mild-to-moderate nonspecific circumferential skin thickening and subcutaneous edema within the lower leg and distal thigh, suspicious for cellulitis. No focal fluid collection, soft tissue emphysema or unexpected foreign body identified. 2. No evidence of osteomyelitis or septic joint. 3. Prominent left inguinal lymph nodes, likely reactive in this clinical context. Correlate clinically to exclude other etiologies. 4. Stable postsurgical changes in the proximal tibia with underlying moderate tricompartmental degenerative changes at the left knee. Electronically Signed   By: Elsie Perone M.D.   On: 03/08/2024 16:56   CT FOOT LEFT W CONTRAST Result Date: 03/08/2024 CLINICAL DATA:  Soft tissue infection suspected, lower leg, xray done. Lateral wound in the proximal lower leg with erythema extending into the distal thigh. EXAM: CT OF THE LOWER LEFT EXTREMITY WITH CONTRAST  TECHNIQUE: Multidetector CT imaging of the lower left extremity was performed according to the standard protocol following intravenous contrast administration. This examination is a large field of view survey with images extending from the level of the femoral head into the foot. Portions of the distal forefoot are incompletely visualized. RADIATION DOSE REDUCTION: This exam was performed according to the departmental dose-optimization program which includes automated exposure control, adjustment of the mA and/or kV according to patient size and/or use of iterative reconstruction technique. CONTRAST:  OMNIPAQUE  IOHEXOL  300 MG/ML SOLN, 100mL OMNIPAQUE  IOHEXOL  300 MG/ML SOLN COMPARISON:  None recent.  Left knee radiographs 10/16/2007. FINDINGS: Bones/Joint/Cartilage No evidence of acute fracture, dislocation or osteomyelitis. Stable postsurgical changes in the proximal tibia with 2 laterally inserted screws in the tibial plateau. Underlying moderate tricompartmental degenerative changes at the left knee. There is a small knee joint effusion. No significant arthropathy or effusion at the left ankle. The left hip joint is incompletely visualized. Ligaments Suboptimally assessed by CT. Muscles and Tendons No intramuscular fluid collection, suspicious enhancement or focal atrophy demonstrated. The quadriceps and patellar tendons are intact. The ankle tendons appear intact as evaluated by CT, without significant tenosynovitis. Soft tissues Mild-to-moderate nonspecific circumferential skin thickening and subcutaneous edema within the lower leg and distal thigh. There is distal extension into the dorsal aspect of the foot. Possible mild skin ulceration anteriorly in the lower leg. No focal fluid collection, soft tissue emphysema or unexpected foreign body identified. No acute vascular findings are seen. Prominent left inguinal lymph nodes measuring up to 2.3 cm transverse are likely reactive in this clinical context.  Incidentally noted are small bilateral scrotal hydroceles and a Foley catheter. IMPRESSION: 1. Mild-to-moderate nonspecific circumferential skin thickening and subcutaneous edema within the lower leg and distal thigh, suspicious for cellulitis. No focal fluid collection, soft tissue emphysema or unexpected foreign body identified. 2. No evidence of osteomyelitis or septic joint. 3. Prominent left inguinal lymph nodes, likely reactive in this clinical context. Correlate clinically to exclude other etiologies. 4. Stable postsurgical changes in the proximal tibia with underlying moderate tricompartmental degenerative changes at the left knee. Electronically Signed   By: Elsie Perone M.D.   On: 03/08/2024 16:56   CT CHEST ABDOMEN PELVIS W CONTRAST Result Date: 03/08/2024 CLINICAL DATA:  Found down, trauma EXAM: CT CHEST, ABDOMEN, AND PELVIS WITH CONTRAST  TECHNIQUE: Multidetector CT imaging of the chest, abdomen and pelvis was performed following the standard protocol during bolus administration of intravenous contrast. RADIATION DOSE REDUCTION: This exam was performed according to the departmental dose-optimization program which includes automated exposure control, adjustment of the mA and/or kV according to patient size and/or use of iterative reconstruction technique. CONTRAST:  OMNIPAQUE  IOHEXOL  300 MG/ML SOLN, OMNIPAQUE  IOHEXOL  300 MG/ML SOLN COMPARISON:  05/21/2023 FINDINGS: CT CHEST FINDINGS Cardiovascular: The heart is enlarged without pericardial effusion. No evidence of vascular injury. No evidence of thoracic aortic aneurysm or dissection. Atherosclerosis of the aorta and coronary vasculature. Mediastinum/Nodes: Left supraclavicular lymphadenopathy measuring up to 16 mm in short axis reference image 7/6. No axillary, mediastinal, or hilar adenopathy. Thyroid gland, trachea, and esophagus demonstrate no significant findings. Lungs/Pleura: Bilateral dependent hypoventilatory changes. No acute  airspace disease, effusion, or pneumothorax. The central airways are patent. Musculoskeletal: There are no acute displaced fractures. Reconstructed images demonstrate no additional findings. CT ABDOMEN PELVIS FINDINGS Hepatobiliary: No hepatic injury or perihepatic hematoma. Gallbladder is unremarkable. Pancreas: Unremarkable. No pancreatic ductal dilatation or surrounding inflammatory changes. Spleen: The spleen is enlarged, measuring 13.6 x 9.2 x 17.4 cm. No evidence of splenic injury. Adrenals/Urinary Tract: No adrenal hemorrhage or renal injury identified. Bladder is decompressed with a Foley catheter. Stomach/Bowel: No bowel obstruction or ileus. No bowel wall thickening or inflammatory change. Vascular/Lymphatic: Atherosclerosis of the aorta and iliac vessels. Retroperitoneal and inguinal lymphadenopathy. Largest lymph node in the left inguinal region measures 22 mm in short axis, reference image 137/6. Left external iliac node measures 13 mm in short axis reference image 115/6. Multiple borderline enlarged retroperitoneal nodes are seen with largest measuring 10 mm in the left para-aortic region reference image 71/6. There is retroperitoneal fat stranding extending along the left pelvic sidewall, likely related to lymphadenopathy. Reproductive: Prostate is unremarkable. Other: No free fluid or free intraperitoneal gas. No abdominal wall hernia. Musculoskeletal: No acute or destructive bony abnormalities. Bilateral L5 spondylolysis without spondylolisthesis. Reconstructed images demonstrate no additional findings. IMPRESSION: 1. No evidence of acute intrathoracic, intra-abdominal, or intrapelvic trauma. 2. Splenomegaly, with left supraclavicular, retroperitoneal, and left inguinal regions as above. Findings are concerning for underlying lymphoproliferative disorder. 3. Cardiomegaly. 4.  Aortic Atherosclerosis (ICD10-I70.0). Electronically Signed   By: Ozell Daring M.D.   On: 03/08/2024 16:54   CT T-SPINE  NO CHARGE Result Date: 03/08/2024 CLINICAL DATA:  Found down EXAM: CT Thoracic and Lumbar spine with contrast TECHNIQUE: Multiplanar CT images of the thoracic and lumbar spine were reconstructed from contemporary CT of the Chest, Abdomen, and Pelvis. RADIATION DOSE REDUCTION: This exam was performed according to the departmental dose-optimization program which includes automated exposure control, adjustment of the mA and/or kV according to patient size and/or use of iterative reconstruction technique. CONTRAST:  No additional COMPARISON:  05/21/2023 FINDINGS: CT THORACIC SPINE FINDINGS Alignment: Alignment is grossly anatomic. Vertebrae: No acute fracture or focal pathologic process. Paraspinal and other soft tissues: The paraspinal soft tissues are unremarkable. Please refer to dedicated chest CT for intrathoracic findings. Disc levels: Diffuse multilevel thoracic spondylosis is unchanged, without bony encroachment upon the central canal or neural foramina. Reconstructed images demonstrate no additional findings. CT LUMBAR SPINE FINDINGS Segmentation: 5 lumbar type vertebrae. Alignment: Alignment is grossly anatomic. Vertebrae: Bilateral L5 spondylolysis without evidence of spondylolisthesis. No acute displaced fractures. No destructive bony lesions. Paraspinal and other soft tissues: Paraspinal soft tissues are unremarkable. Please refer to CT abdomen and pelvis exam for intra-abdominal and intrapelvic findings. Disc levels:  Mild broad-based disc bulges are seen at L1-2, L2-3, L3-4, without significant compressive sequela. At L4-5 there is mild circumferential disc bulge and bilateral facet hypertrophy resulting in mild symmetrical neural foraminal encroachment. At L5-S1 there is circumferential disc bulge and prominent bilateral facet hypertrophy which results in significant bilateral symmetrical neural foraminal encroachment. Reconstructed images demonstrate no additional findings. IMPRESSION: 1. No acute  thoracic or lumbar spine fractures. 2. Mild diffuse thoracic spondylosis without significant compressive sequela. 3. Multilevel lumbar degenerative changes, greatest at L5-S1. 4. Bilateral L5 spondylolysis without spondylolisthesis. Electronically Signed   By: Ozell Daring M.D.   On: 03/08/2024 16:45   CT L-SPINE NO CHARGE Result Date: 03/08/2024 CLINICAL DATA:  Found down EXAM: CT Thoracic and Lumbar spine with contrast TECHNIQUE: Multiplanar CT images of the thoracic and lumbar spine were reconstructed from contemporary CT of the Chest, Abdomen, and Pelvis. RADIATION DOSE REDUCTION: This exam was performed according to the departmental dose-optimization program which includes automated exposure control, adjustment of the mA and/or kV according to patient size and/or use of iterative reconstruction technique. CONTRAST:  No additional COMPARISON:  05/21/2023 FINDINGS: CT THORACIC SPINE FINDINGS Alignment: Alignment is grossly anatomic. Vertebrae: No acute fracture or focal pathologic process. Paraspinal and other soft tissues: The paraspinal soft tissues are unremarkable. Please refer to dedicated chest CT for intrathoracic findings. Disc levels: Diffuse multilevel thoracic spondylosis is unchanged, without bony encroachment upon the central canal or neural foramina. Reconstructed images demonstrate no additional findings. CT LUMBAR SPINE FINDINGS Segmentation: 5 lumbar type vertebrae. Alignment: Alignment is grossly anatomic. Vertebrae: Bilateral L5 spondylolysis without evidence of spondylolisthesis. No acute displaced fractures. No destructive bony lesions. Paraspinal and other soft tissues: Paraspinal soft tissues are unremarkable. Please refer to CT abdomen and pelvis exam for intra-abdominal and intrapelvic findings. Disc levels: Mild broad-based disc bulges are seen at L1-2, L2-3, L3-4, without significant compressive sequela. At L4-5 there is mild circumferential disc bulge and bilateral facet  hypertrophy resulting in mild symmetrical neural foraminal encroachment. At L5-S1 there is circumferential disc bulge and prominent bilateral facet hypertrophy which results in significant bilateral symmetrical neural foraminal encroachment. Reconstructed images demonstrate no additional findings. IMPRESSION: 1. No acute thoracic or lumbar spine fractures. 2. Mild diffuse thoracic spondylosis without significant compressive sequela. 3. Multilevel lumbar degenerative changes, greatest at L5-S1. 4. Bilateral L5 spondylolysis without spondylolisthesis. Electronically Signed   By: Ozell Daring M.D.   On: 03/08/2024 16:45   CT Cervical Spine Wo Contrast Result Date: 03/08/2024 EXAM: CT CERVICAL SPINE WITHOUT CONTRAST 03/08/2024 04:30:12 PM TECHNIQUE: CT of the cervical spine was performed without the administration of intravenous contrast. Multiplanar reformatted images are provided for review. Automated exposure control, iterative reconstruction, and/or weight based adjustment of the mA/kV was utilized to reduce the radiation dose to as low as reasonably achievable. COMPARISON: None available. CLINICAL HISTORY: Neck trauma, intoxicated or obtunded (Age >= 16y). Neck trauma; intoxicated or obtunded; age >= 16 years. FINDINGS: BONES AND ALIGNMENT: Straightening of the normal cervical lordosis. No evidence of traumatic malalignment. DEGENERATIVE CHANGES: Disc space narrowing and degenerative endplate osteophytes at multiple levels. The cervical spinal canal appears congenitally narrowed with additional spinal canal narrowing secondary to disc osteophyte complexes. There is facet arthrosis and uncovertebral hypertrophy at multiple levels. There is significant foraminal stenosis at multiple levels throughout the cervical spine, overall most pronounced at the C4-C5 level. Recommended correlation with symptoms of cervical radiculopathy and consider nonemergent MRI of the cervical spine for further evaluation if clinically  indicated. SOFT TISSUES: No  prevertebral soft tissue swelling. IMPRESSION: 1. No evidence of acute traumatic injury. 2. 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 Consider nonemergent MRI of the cervical spine for further evaluation if radicular symptoms are present. Electronically signed by: Donnice Mania MD 03/08/2024 04:44 PM EST RP Workstation: [id number]   CT Head Wo Contrast Result Date: 03/08/2024 EXAM: CT HEAD WITHOUT CONTRAST 03/08/2024 04:30:12 PM TECHNIQUE: CT of the head was performed without the administration of intravenous contrast. Automated exposure control, iterative reconstruction, and/or weight based adjustment of the mA/kV was utilized to reduce the radiation dose to as low as reasonably achievable. COMPARISON: 06/28/2019 CLINICAL HISTORY: Head trauma with abnormal mental status (Age 49-64y). FINDINGS: BRAIN AND VENTRICLES: No acute hemorrhage. No evidence of acute infarct. No hydrocephalus. No extra-axial collection. No mass effect or midline shift. ORBITS: No acute abnormality. SINUSES: Mucosal thickening in the partially visualized left maxillary sinus. Mucosal thickening versus thin septations in the left sphenoid sinus. Calcification in the left anterior ethmoid air cells likely reflecting an osteoma. There is adjacent mucosal thickening. SOFT TISSUES AND SKULL: Chronic bilateral nasal bone fractures. No acute soft tissue abnormality. IMPRESSION: 1. No acute intracranial abnormality. 2. Chronic bilateral nasal bone fractures. Electronically signed by: Donnice Mania MD 03/08/2024 04:39 PM EST RP Workstation: [id number]   DG Pelvis Portable Result Date: 03/08/2024 EXAM: 1 OR 2 VIEW(S) XRAY OF THE PELVIS 03/08/2024 12:59:00 PM COMPARISON: CT 06/28/2019. CLINICAL HISTORY: Altered mental status; trauma. FINDINGS: BONES AND JOINTS: No acute fracture. No malalignment. Mild degenerative changes of the left hip joint with  mild joint space narrowing and osteophytosis of the superior acetabulum. SOFT TISSUES: Unremarkable. IMPRESSION: 1. No evidence of acute traumatic injury. Electronically signed by: Katheleen Faes MD 03/08/2024 01:19 PM EST RP Workstation: [id number]   DG Chest Portable 1 View Result Date: 03/08/2024 EXAM: 1 VIEW(S) XRAY OF THE CHEST 03/08/2024 12:59:00 PM COMPARISON: 05/22/2023 CLINICAL HISTORY: Altered mental status; trauma. FINDINGS: LINES, TUBES AND DEVICES: Interval extubation and removal of gastric tube. LUNGS AND PLEURA: Low lung volumes. Bibasilar airspace opacities, favored to represent atelectasis. Query mild central pulmonary vascular congestion. No pleural effusion. No pneumothorax. HEART AND MEDIASTINUM: No acute abnormality of the cardiac and mediastinal silhouettes. BONES AND SOFT TISSUES: No acute osseous abnormality. IMPRESSION: 1. Low lung volumes with bibasilar airspace opacities, favored to represent atelectasis. 2. Possible mild central pulmonary vascular congestion. Electronically signed by: Dayne Hassell MD 03/08/2024 01:18 PM EST RP Workstation: [id number]        Scheduled Meds:  enoxaparin  (LOVENOX ) injection  40 mg Subcutaneous Q24H   fluticasone  furoate-vilanterol  1 puff Inhalation Daily   Continuous Infusions:  0.9 % NaCl with KCl 40 mEq / L Stopped (03/09/24 1058)   cefTRIAXone  (ROCEPHIN )  IV     vancomycin  Stopped (03/09/24 0516)     LOS: 1 day    Time spent: 65  minutes spent on chart review, discussion with nursing staff, consultants, updating family and interview/physical exam; more than 50% of that time was spent in counseling and/or coordination of care.    Harlene RAYMOND Bowl, DO [facility] Available via Epic secure chat 7am-7pm After these hours, please refer to coverage provider listed on [email or web address] 03/09/2024, 11:16 AM   "

## 2024-03-09 NOTE — Discharge Instructions (Signed)

## 2024-03-09 NOTE — ED Notes (Signed)
 Report given to [facility] in ICU

## 2024-03-09 NOTE — Progress Notes (Signed)
" °  Transition of Care [facility]) Screening Note   Patient Details  Name: Nathaniel [name] Date of Birth: Apr 24, 1970   Transition of Care [facility]) CM/SW Contact:    Hoy DELENA Bigness, LCSW Phone Number: 03/09/2024, 10:32 AM    Transition of Care Department [facility]) has reviewed patient and no TOC needs have been identified at this time. We will continue to monitor patient advancement through interdisciplinary progression rounds. If new patient transition needs arise, please place a TOC consult.    03/09/24 1031  TOC Brief Assessment  Insurance and Status Reviewed  Patient has primary care physician No (PCP list added to AVS)  Home environment has been reviewed From home  Prior level of function: Independent  Prior/Current Home Services No current home services  Social Drivers of Health Review SDOH reviewed no interventions necessary  Readmission risk has been reviewed Yes  Transition of care needs no transition of care needs at this time    "

## 2024-03-09 NOTE — Plan of Care (Signed)
" °  Problem: Education: Goal: Knowledge of General Education information will improve Description: Including pain rating scale, medication(s)/side effects and non-pharmacologic comfort measures Outcome: Progressing   Problem: Clinical Measurements: Goal: Ability to maintain clinical measurements within normal limits will improve Outcome: Progressing Goal: Will remain free from infection Outcome: Progressing Goal: Diagnostic test results will improve Outcome: Progressing Goal: Respiratory complications will improve Outcome: Progressing Goal: Cardiovascular complication will be avoided Outcome: Progressing   Problem: Activity: Goal: Risk for activity intolerance will decrease Outcome: Progressing   Problem: Nutrition: Goal: Adequate nutrition will be maintained Outcome: Progressing   Problem: Elimination: Goal: Will not experience complications related to bowel motility Outcome: Not Applicable Goal: Will not experience complications related to urinary retention Outcome: Not Applicable   Problem: Coping: Goal: Level of anxiety will decrease Outcome: Progressing   Problem: Pain Managment: Goal: General experience of comfort will improve and/or be controlled Outcome: Progressing   Problem: Elimination: Goal: Will not experience complications related to bowel motility Outcome: Not Applicable Goal: Will not experience complications related to urinary retention Outcome: Not Applicable   Problem: Pain Managment: Goal: General experience of comfort will improve and/or be controlled Outcome: Progressing   Problem: Safety: Goal: Ability to remain free from injury will improve Outcome: Progressing   Problem: Skin Integrity: Goal: Risk for impaired skin integrity will decrease Outcome: Progressing   Problem: Clinical Measurements: Goal: Ability to avoid or minimize complications of infection will improve Outcome: Progressing   Problem: Skin Integrity: Goal: Skin  integrity will improve Outcome: Progressing   "

## 2024-03-10 DIAGNOSIS — J9601 Acute respiratory failure with hypoxia: Secondary | ICD-10-CM

## 2024-03-10 DIAGNOSIS — M6282 Rhabdomyolysis: Secondary | ICD-10-CM

## 2024-03-10 DIAGNOSIS — R4182 Altered mental status, unspecified: Secondary | ICD-10-CM

## 2024-03-10 DIAGNOSIS — R161 Splenomegaly, not elsewhere classified: Secondary | ICD-10-CM

## 2024-03-10 DIAGNOSIS — Z6841 Body Mass Index (BMI) 40.0 and over, adult: Secondary | ICD-10-CM

## 2024-03-10 DIAGNOSIS — R609 Edema, unspecified: Secondary | ICD-10-CM

## 2024-03-10 DIAGNOSIS — B954 Other streptococcus as the cause of diseases classified elsewhere: Secondary | ICD-10-CM | POA: Diagnosis not present

## 2024-03-10 DIAGNOSIS — M4802 Spinal stenosis, cervical region: Secondary | ICD-10-CM

## 2024-03-10 DIAGNOSIS — L539 Erythematous condition, unspecified: Secondary | ICD-10-CM

## 2024-03-10 DIAGNOSIS — R7881 Bacteremia: Secondary | ICD-10-CM

## 2024-03-10 LAB — URINE CULTURE: Culture: <10000 — AB

## 2024-03-10 LAB — CBC
HCT: 34.6 % — ABNORMAL LOW (ref 39.0–52.0)
Hemoglobin: 9.7 g/dL — ABNORMAL LOW (ref 13.0–17.0)
MCH: 24.4 pg — ABNORMAL LOW (ref 26.0–34.0)
MCHC: 28 g/dL — ABNORMAL LOW (ref 30.0–36.0)
MCV: 87.2 fL (ref 80.0–100.0)
Platelets: 133 10*3/uL — ABNORMAL LOW (ref 150–400)
RBC: 3.97 MIL/uL — ABNORMAL LOW (ref 4.22–5.81)
RDW: 18.6 % — ABNORMAL HIGH (ref 11.5–15.5)
WBC: 8.7 10*3/uL (ref 4.0–10.5)
nRBC: 0 % (ref 0.0–0.2)

## 2024-03-10 LAB — BASIC METABOLIC PANEL WITH GFR
Anion gap: 13 (ref 5–15)
BUN: 24 mg/dL — ABNORMAL HIGH (ref 6–20)
CO2: 29 mmol/L (ref 22–32)
Calcium: 8 mg/dL — ABNORMAL LOW (ref 8.9–10.3)
Chloride: 96 mmol/L — ABNORMAL LOW (ref 98–111)
Creatinine, Ser: 0.8 mg/dL (ref 0.61–1.24)
GFR, Estimated: >60 mL/min
Glucose, Bld: 127 mg/dL — ABNORMAL HIGH (ref 70–99)
Potassium: 3.6 mmol/L (ref 3.5–5.1)
Sodium: 137 mmol/L (ref 135–145)

## 2024-03-10 LAB — GLUCOSE, CAPILLARY
Glucose-Capillary: 138 mg/dL — ABNORMAL HIGH (ref 70–99)
Glucose-Capillary: 135 mg/dL — ABNORMAL HIGH (ref 70–99)

## 2024-03-10 LAB — CK: Total CK: 782 U/L — ABNORMAL HIGH (ref 49–397)

## 2024-03-10 MED ORDER — NALOXONE HCL 0.4 MG/ML IJ SOLN
0.4000 mg | INTRAMUSCULAR | Status: AC | PRN
  Administered 2024-03-10 (×3): 0.4 mg via INTRAVENOUS
  Filled 2024-03-10 (×3): qty 1

## 2024-03-10 MED ORDER — PANTOPRAZOLE SODIUM 40 MG PO TBEC
40.0000 mg | DELAYED_RELEASE_TABLET | Freq: Every day | ORAL | Status: AC
  Administered 2024-03-10 – 2024-03-11 (×2): 40 mg via ORAL
  Filled 2024-03-10 (×2): qty 1

## 2024-03-10 MED ORDER — MAGNESIUM HYDROXIDE 400 MG/5ML PO SUSP
5.0000 mL | Freq: Every day | ORAL | Status: AC | PRN
  Administered 2024-03-10: 5 mL via ORAL
  Filled 2024-03-10: qty 30

## 2024-03-10 MED ORDER — METHADONE HCL 10 MG PO TABS
175.0000 mg | ORAL_TABLET | Freq: Every day | ORAL | Status: DC
  Administered 2024-03-10: 175 mg via ORAL
  Filled 2024-03-10: qty 18

## 2024-03-10 MED ORDER — PENICILLIN G POTASSIUM 20000000 UNITS IJ SOLR
24.0000 10*6.[IU] | INTRAVENOUS | Status: AC
  Administered 2024-03-10 – 2024-03-11 (×2): 24 10*6.[IU] via INTRAVENOUS
  Filled 2024-03-10: qty 24
  Filled 2024-03-10: qty 20
  Filled 2024-03-10: qty 24

## 2024-03-10 MED ORDER — METHADONE HCL 10 MG PO TABS: 100.0000 mg | ORAL_TABLET | Freq: Every day | ORAL | Status: DC

## 2024-03-10 NOTE — Progress Notes (Signed)
" °   03/10/24 1859  BiPAP/CPAP/SIPAP  $ Non-Invasive Ventilator  Non-Invasive Vent Initial  $ Face Mask Large  Yes  BiPAP/CPAP/SIPAP Pt Type Adult  BiPAP/CPAP/SIPAP DREAMSTATIOND  Mask Type Full face mask  Dentures removed? Not applicable  Mask Size Large  Respiratory Rate 12 breaths/min  IPAP 20 cmH20  EPAP 10 cmH2O  FiO2 (%) 44 %  Patient Home Machine No  Patient Home Mask No  Patient Home Tubing No  Auto Titrate No  Device Plugged into RED Power Outlet Yes    "

## 2024-03-10 NOTE — Progress Notes (Signed)
 Gave pt methadone  at 1110. Hour later pts o2 was dropping between 88-92% when I can get him to stay awake. 77-83% when he sleeps. Pt wasn't responded to this nurse appropriately did a sternal rub.  placed pt on 15 L HFNC. Notified Harlene Bowl. PRN narcan  given with charge nurse at bedside. Pt now responding and A x O x4. And awake. O2 98% on 2L HFNC. DO made adjustments to methadone  order for this evening.

## 2024-03-10 NOTE — Progress Notes (Signed)
 Pt hasn't voided since foley removal, bladder scaned pt and it showed 158 ml in bladder. Will reassess. Pt states he doesn't need to pee.

## 2024-03-10 NOTE — Progress Notes (Addendum)
 " PROGRESS NOTE    Nathaniel [name]  FMW:[id number] DOB: 06/12/1970 DOA: 03/08/2024 PCP: Freddrick, No    Brief Narrative:  Nathaniel [name] is a 54 y.o. male with medical history significant for COPD, morbid obesity, heroin use disorder.  Patient was brought to the ED via EMS for reports of altered mental status.  Patient was found lying facedown on the floor by his brother.  It is unknown how long patient had been on the floor.  Found to have a lower extremity cellulitis.     Assessment and Plan: Severe sepsis 2/2 left lower extremity cellulitis-meeting severe sepsis criteria with tachycardia heart rate 91-112, leukocytosis of 15.1, with evidence of endorgan dysfunction hypoxia, encephalopathy and lactic acidosis of 2.5 >> 1.5.   -Etiology likely left lower extremity cellulitis - Baseline chronic lymphedema with induration.   -CT left lower extremity-suggest cellulitis, no fluid collection or foreign body identified.  - IV abx - Follow-up blood cultures- gram + cocci- strep G (will get ID consult for help with duration/type of abx) -repeat Blood cultures -monitor in SDU  Rhabdo - CK trending down  Acute hypoxic respiratory failure-O2 sats down to 79% on room air, initially placed on 15 L nonrebreather then switched to nasal cannula.  CT chest with contrast without acute chest abnormality.  Patient's responded to 0.4 mg of Narcan .  History of heroin use.  On methadone . -wean O2 as able -IS   Acute metabolic encephalopathy - Likely secondary to severe sepsis, cellulitis, hypoxia, and possibly narcotic use.  Patient found facedown on the floor by his brother.  Head CT negative for acute abnormality.  Cervical spine CT shows significant multilevel foraminal stenosis nonemergent MRI recommended, if radicular symptoms present. - IV antibiotics, hydrate   Chronic pain - Attempted to start patient back on his home dose of methadone  but he became very sleepy -Will cut dose in half   Hypokalemia,  hyponatremia-potassium 2.6.  Sodium 133. - repleted   COPD-no wheezing on exam. - DuoNebs as needed   H/o Heroin abuse   Lymphadenopathy-CT findings chest abdomen pelvis with contrast-  Splenomegaly, with left supraclavicular, retroperitoneal, and left inguinal regions as above. Findings are concerning for underlying lymphoproliferative disorder. - Follow-up as outpatient   Severe obesity Estimated body mass index is 43.35 kg/m as calculated from the following:   Height as of this encounter: 6' (1.829 m).   Weight as of this encounter: 145 kg.   DC Foley  DVT prophylaxis: enoxaparin  (LOVENOX ) injection 40 mg Start: 03/08/24 2130    Code Status: Full Code Family Communication: None at bedside  Disposition Plan:  Level of care: Stepdown Status is: Inpatient       Subjective: Asking for resumption of his methadone  - Verified dosage with his office  Objective: Vitals:   03/10/24 0800 03/10/24 0900 03/10/24 1000 03/10/24 1100  BP: (!) 137/44 (!) 84/37 131/84   Pulse: 78     Resp: 11 10 (!) 8   Temp:    (!) 96 F (35.6 C)  TempSrc:      SpO2: 100%     Weight:      Height:        Intake/Output Summary (Last 24 hours) at 03/10/2024 1228 Last data filed at 03/10/2024 9047 Gross per 24 hour  Intake 816.67 ml  Output 1775 ml  Net -958.33 ml   Filed Weights   03/08/24 1228  Weight: (!) 145 kg    Examination:   General: Appearance:  Severely obese male in no acute distress        Lungs:     On [place], respirations unlabored  Heart:    Normal heart rate.    MS:   All extremities are intact.    Neurologic:   Awake, alert       Data Reviewed: I have personally reviewed following labs and imaging studies  CBC: Recent Labs  Lab 03/08/24 1240 03/09/24 0245 03/10/24 0456  WBC 15.1* 10.1 8.7  NEUTROABS 13.6*  --   --   HGB 12.1* 10.8* 9.7*  HCT 41.1 37.0* 34.6*  MCV 83.5 83.9 87.2  PLT 163 137* 133*   Basic Metabolic Panel: Recent Labs  Lab  03/08/24 1240 03/08/24 1551 03/09/24 0245 03/10/24 0456  NA 133*  --  136 137  K 2.6*  --  3.1* 3.6  CL 88*  --  94* 96*  CO2 28  --  32 29  GLUCOSE 148*  --  119* 127*  BUN 19  --  19 24*  CREATININE 1.13  --  0.85 0.80  CALCIUM 8.6*  --  7.8* 8.0*  MG  --  2.0  --   --    GFR: Estimated Creatinine Clearance: 158 mL/min (by C-G formula based on SCr of 0.8 mg/dL). Liver Function Tests: Recent Labs  Lab 03/08/24 1240  AST 57*  ALT 18  ALKPHOS 82  BILITOT 1.1  PROT 7.2  ALBUMIN 3.7   No results for input(s): LIPASE, AMYLASE in the last 168 hours. No results for input(s): AMMONIA in the last 168 hours. Coagulation Profile: Recent Labs  Lab 03/08/24 1240  INR 1.1   Cardiac Enzymes: Recent Labs  Lab 03/08/24 1551 03/10/24 0456  CKTOTAL 2,072* 782*   BNP (last 3 results) No results for input(s): PROBNP in the last 8760 hours. HbA1C: No results for input(s): HGBA1C in the last 72 hours. CBG: Recent Labs  Lab 03/08/24 1355 03/09/24 1641 03/09/24 2333 03/10/24 0731  GLUCAP 123* 134* 143* 138*   Lipid Profile: No results for input(s): CHOL, HDL, LDLCALC, TRIG, CHOLHDL, LDLDIRECT in the last 72 hours. Thyroid Function Tests: No results for input(s): TSH, T4TOTAL, FREET4, T3FREE, THYROIDAB in the last 72 hours. Anemia Panel: No results for input(s): VITAMINB12, FOLATE, FERRITIN, TIBC, IRON, RETICCTPCT in the last 72 hours. Sepsis Labs: Recent Labs  Lab 03/08/24 1240 03/08/24 1551  LATICACIDVEN 2.5* 1.5    Recent Results (from the past 240 hours)  Blood culture (routine x 2)     Status: Abnormal (Preliminary result)   Collection Time: 03/08/24 12:40 PM   Specimen: BLOOD  Result Value Ref Range Status   Specimen Description   Final    BLOOD BLOOD RIGHT ARM Performed at [facility], [address], [place], [place] [address]    Special Requests   Final    Blood Culture adequate volume BOTTLES DRAWN AEROBIC  AND ANAEROBIC Performed at [facility], [address], [place], [place] [address]    Culture  Setup Time   Final    GRAM POSITIVE COCCI IN PAIRS AND CHAINS IN BOTH AEROBIC AND ANAEROBIC BOTTLES CRITICAL VALUE NOTED.  VALUE IS CONSISTENT WITH PREVIOUSLY REPORTED AND CALLED VALUE. Performed at [facility], [address], [place], [place] [address]    Culture STREPTOCOCCUS GROUP G (A)  Final   Report Status PENDING  Incomplete  Blood culture (routine x 2)     Status: Abnormal (Preliminary result)   Collection Time: 03/08/24 12:41  PM   Specimen: BLOOD  Result Value Ref Range Status   Specimen Description   Final    BLOOD BLOOD LEFT ARM Performed at [facility], [address] [place], [place] [address]    Special Requests   Final    BOTTLES DRAWN AEROBIC AND ANAEROBIC Blood Culture adequate volume Performed at [facility], [address] [place], [place] [address]    Culture  Setup Time   Final    GRAM POSITIVE COCCI IN PAIRS AND CHAINS IN BOTH AEROBIC AND ANAEROBIC BOTTLES Gram Stain Report Called to,Read Back By and Verified With: R THOMPSON,RN@0015  03/09/24 MK CRITICAL RESULT CALLED TO, READ BACK BY AND VERIFIED WITH: COURTNEY P FLOOR RN  03/09/2024 BY DD @ 0505    Culture (A)  Final    STREPTOCOCCUS GROUP G SUSCEPTIBILITIES TO FOLLOW STAPHYLOCOCCUS EPIDERMIDIS THE SIGNIFICANCE OF ISOLATING THIS ORGANISM FROM A SINGLE SET OF BLOOD CULTURES WHEN MULTIPLE SETS ARE DRAWN IS UNCERTAIN. PLEASE NOTIFY THE MICROBIOLOGY DEPARTMENT WITHIN ONE WEEK IF SPECIATION AND SENSITIVITIES ARE REQUIRED. Performed at [facility], [address], [place], [place] [address]    Report Status PENDING  Incomplete  Blood Culture ID Panel (Reflexed)     Status: Abnormal   Collection Time: 03/08/24 12:41 PM  Result Value Ref Range Status   Enterococcus faecalis NOT DETECTED NOT DETECTED Final   Enterococcus Faecium NOT DETECTED NOT DETECTED Final   Listeria monocytogenes NOT DETECTED NOT  DETECTED Final   Staphylococcus species DETECTED (A) NOT DETECTED Final    Comment: CRITICAL RESULT CALLED TO, READ BACK BY AND VERIFIED WITH: COURTNEY P FLOOR RN  03/09/2024 BY DD @ 0505    Staphylococcus aureus (BCID) NOT DETECTED NOT DETECTED Final   Staphylococcus epidermidis DETECTED (A) NOT DETECTED Final    Comment: Methicillin (oxacillin) resistant coagulase negative staphylococcus. Possible blood culture contaminant (unless isolated from more than one blood culture draw or clinical case suggests pathogenicity). No antibiotic treatment is indicated for blood  culture contaminants. CRITICAL RESULT CALLED TO, READ BACK BY AND VERIFIED WITH: COURTNEY P FLOOR RN  03/09/2024 BY DD @ 0505    Staphylococcus lugdunensis NOT DETECTED NOT DETECTED Final   Streptococcus species DETECTED (A) NOT DETECTED Final    Comment: Not Enterococcus species, Streptococcus agalactiae, Streptococcus pyogenes, or Streptococcus pneumoniae. CRITICAL RESULT CALLED TO, READ BACK BY AND VERIFIED WITH: COURTNEY P FLOOR RN  03/09/2024 BY DD @ 0505    Streptococcus agalactiae NOT DETECTED NOT DETECTED Final   Streptococcus pneumoniae NOT DETECTED NOT DETECTED Final   Streptococcus pyogenes NOT DETECTED NOT DETECTED Final   A.calcoaceticus-baumannii NOT DETECTED NOT DETECTED Final   Bacteroides fragilis NOT DETECTED NOT DETECTED Final   Enterobacterales NOT DETECTED NOT DETECTED Final   Enterobacter cloacae complex NOT DETECTED NOT DETECTED Final   Escherichia coli NOT DETECTED NOT DETECTED Final   Klebsiella aerogenes NOT DETECTED NOT DETECTED Final   Klebsiella oxytoca NOT DETECTED NOT DETECTED Final   Klebsiella pneumoniae NOT DETECTED NOT DETECTED Final   Proteus species NOT DETECTED NOT DETECTED Final   Salmonella species NOT DETECTED NOT DETECTED Final   Serratia marcescens NOT DETECTED NOT DETECTED Final   Haemophilus influenzae NOT DETECTED NOT DETECTED Final   Neisseria meningitidis NOT DETECTED NOT  DETECTED Final   Pseudomonas aeruginosa NOT DETECTED NOT DETECTED Final   Stenotrophomonas maltophilia NOT DETECTED NOT DETECTED Final   Candida albicans NOT DETECTED NOT DETECTED Final   Candida auris NOT DETECTED NOT DETECTED Final  Candida glabrata NOT DETECTED NOT DETECTED Final   Candida krusei NOT DETECTED NOT DETECTED Final   Candida parapsilosis NOT DETECTED NOT DETECTED Final   Candida tropicalis NOT DETECTED NOT DETECTED Final   Cryptococcus neoformans/gattii NOT DETECTED NOT DETECTED Final   Methicillin resistance mecA/C DETECTED (A) NOT DETECTED Final    Comment: CRITICAL RESULT CALLED TO, READ BACK BY AND VERIFIED WITH: COURTNEY P FLOOR RN  03/09/2024 BY DD @ 0505 Performed at [facility], [address] [place], [place] [address]   Urine Culture (for pregnant, neutropenic or urologic patients or patients with an indwelling urinary catheter)     Status: Abnormal   Collection Time: 03/08/24 12:48 PM   Specimen: Urine, Clean Catch  Result Value Ref Range Status   Specimen Description   Final    URINE, CLEAN CATCH Performed at [facility], [address], [place], [place] [address]    Special Requests   Final    NONE Performed at [facility], [address], [place], [place] [address]    Culture (A)  Final    <10,000 COLONIES/mL INSIGNIFICANT GROWTH Performed at [facility], [address] [place], [place] [address]    Report Status 03/10/2024 FINAL  Final  Resp panel by RT-PCR (RSV, Flu A&B, Covid) Anterior Nasal Swab     Status: None   Collection Time: 03/08/24  1:56 PM   Specimen: Anterior Nasal Swab  Result Value Ref Range Status   SARS Coronavirus 2 by RT PCR NEGATIVE NEGATIVE Final    Comment: (NOTE) SARS-CoV-2 target nucleic acids are NOT DETECTED.  The SARS-CoV-2 RNA is generally detectable in upper respiratory specimens during the acute phase of infection. The lowest concentration of SARS-CoV-2 viral copies this assay can detect is 138  copies/mL. A negative result does not preclude SARS-Cov-2 infection and should not be used as the sole basis for treatment or other patient management decisions. A negative result may occur with  improper specimen collection/handling, submission of specimen other than nasopharyngeal swab, presence of viral mutation(s) within the areas targeted by this assay, and inadequate number of viral copies(<138 copies/mL). A negative result must be combined with clinical observations, patient history, and epidemiological information. The expected result is Negative.  Fact Sheet for Patients:  bloggercourse.com  Fact Sheet for Healthcare Providers:  seriousbroker.it  This test is no t yet approved or cleared by the [place]  FDA and  has been authorized for detection and/or diagnosis of SARS-CoV-2 by FDA under an Emergency Use Authorization (EUA). This EUA will remain  in effect (meaning this test can be used) for the duration of the COVID-19 declaration under Section 564(b)(1) of the Act, 21 U.S.C.section 360bbb-3(b)(1), unless the authorization is terminated  or revoked sooner.       Influenza A by PCR NEGATIVE NEGATIVE Final   Influenza B by PCR NEGATIVE NEGATIVE Final    Comment: (NOTE) The Xpert Xpress SARS-CoV-2/FLU/RSV plus assay is intended as an aid in the diagnosis of influenza from Nasopharyngeal swab specimens and should not be used as a sole basis for treatment. Nasal washings and aspirates are unacceptable for Xpert Xpress SARS-CoV-2/FLU/RSV testing.  Fact Sheet for Patients: bloggercourse.com  Fact Sheet for Healthcare Providers: seriousbroker.it  This test is not yet approved or cleared by the [place]  FDA and has been authorized for detection and/or diagnosis of SARS-CoV-2 by FDA under an Emergency Use Authorization (EUA). This EUA will remain in effect (meaning  this test can be used)  for the duration of the COVID-19 declaration under Section 564(b)(1) of the Act, 21 U.S.C. section 360bbb-3(b)(1), unless the authorization is terminated or revoked.     Resp Syncytial Virus by PCR NEGATIVE NEGATIVE Final    Comment: (NOTE) Fact Sheet for Patients: bloggercourse.com  Fact Sheet for Healthcare Providers: seriousbroker.it  This test is not yet approved or cleared by the [place]  FDA and has been authorized for detection and/or diagnosis of SARS-CoV-2 by FDA under an Emergency Use Authorization (EUA). This EUA will remain in effect (meaning this test can be used) for the duration of the COVID-19 declaration under Section 564(b)(1) of the Act, 21 U.S.C. section 360bbb-3(b)(1), unless the authorization is terminated or revoked.  Performed at [facility], [address] [place], [place] [address]   MRSA Next Gen by PCR, Nasal     Status: Abnormal   Collection Time: 03/09/24  3:17 PM   Specimen: Nasal Mucosa; Nasal Swab  Result Value Ref Range Status   MRSA by PCR Next Gen DETECTED (A) NOT DETECTED Final    Comment: RESULT CALLED TO, READ BACK BY AND VERIFIED WITH: CASSIDY COOK @ 1847 ON 03/09/24 C VARNER (NOTE) The GeneXpert MRSA Assay (FDA approved for NASAL specimens only), is one component of a comprehensive MRSA colonization surveillance program. It is not intended to diagnose MRSA infection nor to guide or monitor treatment for MRSA infections. Test performance is not FDA approved in patients less than 90 years old. Performed at [facility], [address], [place], [place] [address]          Radiology Studies: ECHOCARDIOGRAM COMPLETE Result Date: 03/09/2024    ECHOCARDIOGRAM REPORT   Patient Name:   DEKLEN POPELKA Date of Exam: 03/09/2024 Medical Rec #:  [id number]     Height:       72.0 in Accession #:    [id number]    Weight:       319.7 lb Date of Birth:  08-Mar-1970     BSA:           2.600 m Patient Age:    53 years      BP:           134/84 mmHg Patient Gender: M             HR:           76 bpm. Exam Location:  [facility] Procedure: 2D Echo, Cardiac Doppler, Color Doppler and Intracardiac            Opacification Agent (Both Spectral and Color Flow Doppler were            utilized during procedure). Indications:    Bacteremia R78.81  History:        Patient has prior history of Echocardiogram examinations, most                 recent 05/22/2023. Risk Factors:Hypertension.  Sonographer:    Sydnee Wilson RDCS Referring Phys: [id number] Joanthony Hamza U Atha Mcbain  Sonographer Comments: Patient is obese. Image acquisition challenging due to patient body habitus. IMPRESSIONS  1. Poor acoustic windows for evaluation of infective endocarditis. Valves not well visualized. Consider TEE for further evaluation, if clincally indicated.  2. No LV thrombus by Definity . Left ventricular ejection fraction, by estimation, is 60 to 65%. The left ventricle has normal function. The left ventricle has no regional wall motion abnormalities. Left ventricular diastolic parameters were normal.  3. Right ventricular systolic function is normal. The right ventricular size  is normal. Tricuspid regurgitation signal is inadequate for assessing PA pressure.  4. The mitral valve was not well visualized. No evidence of mitral valve regurgitation. No evidence of mitral stenosis.  5. The aortic valve was not well visualized. Aortic valve regurgitation is not visualized. No aortic stenosis is present.  6. The inferior vena cava is dilated in size with <50% respiratory variability, suggesting right atrial pressure of 15 mmHg. FINDINGS  Left Ventricle: No LV thrombus by Definity . Left ventricular ejection fraction, by estimation, is 60 to 65%. The left ventricle has normal function. The left ventricle has no regional wall motion abnormalities. Definity  contrast agent was given IV to delineate the left ventricular endocardial borders. Strain was  performed and the global longitudinal strain is indeterminate. The left ventricular internal cavity size was normal in size. There is no left ventricular hypertrophy. Left ventricular diastolic parameters were normal. Right Ventricle: The right ventricular size is normal. No increase in right ventricular wall thickness. Right ventricular systolic function is normal. Tricuspid regurgitation signal is inadequate for assessing PA pressure. Left Atrium: Left atrial size was normal in size. Right Atrium: Right atrial size was normal in size. Pericardium: There is no evidence of pericardial effusion. Mitral Valve: The mitral valve was not well visualized. No evidence of mitral valve regurgitation. No evidence of mitral valve stenosis. Tricuspid Valve: The tricuspid valve is not well visualized. Tricuspid valve regurgitation is not demonstrated. No evidence of tricuspid stenosis. Aortic Valve: The aortic valve was not well visualized. Aortic valve regurgitation is not visualized. No aortic stenosis is present. Aortic valve mean gradient measures 5.0 mmHg. Aortic valve peak gradient measures 9.4 mmHg. Aortic valve area, by VTI measures 3.00 cm. Pulmonic Valve: The pulmonic valve was not well visualized. Pulmonic valve regurgitation is not visualized. No evidence of pulmonic stenosis. Aorta: The aortic root is normal in size and structure. Venous: The inferior vena cava is dilated in size with less than 50% respiratory variability, suggesting right atrial pressure of 15 mmHg. IAS/Shunts: No atrial level shunt detected by color flow Doppler. Additional Comments: 3D was performed not requiring image post processing on an independent workstation and was indeterminate.  LEFT VENTRICLE PLAX 2D LVIDd:         6.10 cm      Diastology LVIDs:         4.30 cm      LV e' medial:    11.40 cm/s LV PW:         1.10 cm      LV E/e' medial:  6.7 LV IVS:        1.10 cm      LV e' lateral:   14.10 cm/s LVOT diam:     2.20 cm      LV E/e'  lateral: 5.4 LV SV:         84 LV SV Index:   32 LVOT Area:     3.80 cm  LV Volumes (MOD) LV vol d, MOD A2C: 229.0 ml LV vol d, MOD A4C: 294.0 ml LV vol s, MOD A2C: 64.3 ml LV vol s, MOD A4C: 99.5 ml LV SV MOD A2C:     164.7 ml LV SV MOD A4C:     294.0 ml LV SV MOD BP:      177.9 ml RIGHT VENTRICLE RV S prime:     15.60 cm/s TAPSE (M-mode): 1.7 cm LEFT ATRIUM             Index  RIGHT ATRIUM           Index LA diam:        4.00 cm 1.54 cm/m   RA Area:     13.80 cm LA Vol (A2C):   48.1 ml 18.50 ml/m  RA Volume:   31.00 ml  11.92 ml/m LA Vol (A4C):   50.0 ml 19.23 ml/m LA Biplane Vol: 49.5 ml 19.03 ml/m  AORTIC VALVE AV Area (Vmax):    2.56 cm AV Area (Vmean):   2.73 cm AV Area (VTI):     3.00 cm AV Vmax:           153.00 cm/s AV Vmean:          103.000 cm/s AV VTI:            0.281 m AV Peak Grad:      9.4 mmHg AV Mean Grad:      5.0 mmHg LVOT Vmax:         103.00 cm/s LVOT Vmean:        73.900 cm/s LVOT VTI:          0.222 m LVOT/AV VTI ratio: 0.79  AORTA Ao Root diam: 3.20 cm Ao Asc diam:  3.00 cm MITRAL VALVE MV Area (PHT): 3.34 cm    SHUNTS MV Decel Time: 227 msec    Systemic VTI:  0.22 m MV E velocity: 76.30 cm/s  Systemic Diam: 2.20 cm MV A velocity: 64.90 cm/s MV E/A ratio:  1.18 Vishnu Priya Mallipeddi Electronically signed by Diannah Late Mallipeddi Signature Date/Time: 03/09/2024/2:36:57 PM    Final    CT FEMUR LEFT W CONTRAST Result Date: 03/08/2024 CLINICAL DATA:  Soft tissue infection suspected, lower leg, xray done. Lateral wound in the proximal lower leg with erythema extending into the distal thigh. EXAM: CT OF THE LOWER LEFT EXTREMITY WITH CONTRAST TECHNIQUE: Multidetector CT imaging of the lower left extremity was performed according to the standard protocol following intravenous contrast administration. This examination is a large field of view survey with images extending from the level of the femoral head into the foot. Portions of the distal forefoot are incompletely visualized.  RADIATION DOSE REDUCTION: This exam was performed according to the departmental dose-optimization program which includes automated exposure control, adjustment of the mA and/or kV according to patient size and/or use of iterative reconstruction technique. CONTRAST:  OMNIPAQUE  IOHEXOL  300 MG/ML SOLN, 100mL OMNIPAQUE  IOHEXOL  300 MG/ML SOLN COMPARISON:  None recent.  Left knee radiographs 10/16/2007. FINDINGS: Bones/Joint/Cartilage No evidence of acute fracture, dislocation or osteomyelitis. Stable postsurgical changes in the proximal tibia with 2 laterally inserted screws in the tibial plateau. Underlying moderate tricompartmental degenerative changes at the left knee. There is a small knee joint effusion. No significant arthropathy or effusion at the left ankle. The left hip joint is incompletely visualized. Ligaments Suboptimally assessed by CT. Muscles and Tendons No intramuscular fluid collection, suspicious enhancement or focal atrophy demonstrated. The quadriceps and patellar tendons are intact. The ankle tendons appear intact as evaluated by CT, without significant tenosynovitis. Soft tissues Mild-to-moderate nonspecific circumferential skin thickening and subcutaneous edema within the lower leg and distal thigh. There is distal extension into the dorsal aspect of the foot. Possible mild skin ulceration anteriorly in the lower leg. No focal fluid collection, soft tissue emphysema or unexpected foreign body identified. No acute vascular findings are seen. Prominent left inguinal lymph nodes measuring up to 2.3 cm transverse are likely reactive in this clinical context. Incidentally noted are small bilateral  scrotal hydroceles and a Foley catheter. IMPRESSION: 1. Mild-to-moderate nonspecific circumferential skin thickening and subcutaneous edema within the lower leg and distal thigh, suspicious for cellulitis. No focal fluid collection, soft tissue emphysema or unexpected foreign body identified. 2. No  evidence of osteomyelitis or septic joint. 3. Prominent left inguinal lymph nodes, likely reactive in this clinical context. Correlate clinically to exclude other etiologies. 4. Stable postsurgical changes in the proximal tibia with underlying moderate tricompartmental degenerative changes at the left knee. Electronically Signed   By: Elsie Perone M.D.   On: 03/08/2024 16:56   CT TIBIA FIBULA LEFT W CONTRAST Result Date: 03/08/2024 CLINICAL DATA:  Soft tissue infection suspected, lower leg, xray done. Lateral wound in the proximal lower leg with erythema extending into the distal thigh. EXAM: CT OF THE LOWER LEFT EXTREMITY WITH CONTRAST TECHNIQUE: Multidetector CT imaging of the lower left extremity was performed according to the standard protocol following intravenous contrast administration. This examination is a large field of view survey with images extending from the level of the femoral head into the foot. Portions of the distal forefoot are incompletely visualized. RADIATION DOSE REDUCTION: This exam was performed according to the departmental dose-optimization program which includes automated exposure control, adjustment of the mA and/or kV according to patient size and/or use of iterative reconstruction technique. CONTRAST:  OMNIPAQUE  IOHEXOL  300 MG/ML SOLN, OMNIPAQUE  IOHEXOL  300 MG/ML SOLN COMPARISON:  None recent.  Left knee radiographs 10/16/2007. FINDINGS: Bones/Joint/Cartilage No evidence of acute fracture, dislocation or osteomyelitis. Stable postsurgical changes in the proximal tibia with 2 laterally inserted screws in the tibial plateau. Underlying moderate tricompartmental degenerative changes at the left knee. There is a small knee joint effusion. No significant arthropathy or effusion at the left ankle. The left hip joint is incompletely visualized. Ligaments Suboptimally assessed by CT. Muscles and Tendons No intramuscular fluid collection, suspicious enhancement or focal  atrophy demonstrated. The quadriceps and patellar tendons are intact. The ankle tendons appear intact as evaluated by CT, without significant tenosynovitis. Soft tissues Mild-to-moderate nonspecific circumferential skin thickening and subcutaneous edema within the lower leg and distal thigh. There is distal extension into the dorsal aspect of the foot. Possible mild skin ulceration anteriorly in the lower leg. No focal fluid collection, soft tissue emphysema or unexpected foreign body identified. No acute vascular findings are seen. Prominent left inguinal lymph nodes measuring up to 2.3 cm transverse are likely reactive in this clinical context. Incidentally noted are small bilateral scrotal hydroceles and a Foley catheter. IMPRESSION: 1. Mild-to-moderate nonspecific circumferential skin thickening and subcutaneous edema within the lower leg and distal thigh, suspicious for cellulitis. No focal fluid collection, soft tissue emphysema or unexpected foreign body identified. 2. No evidence of osteomyelitis or septic joint. 3. Prominent left inguinal lymph nodes, likely reactive in this clinical context. Correlate clinically to exclude other etiologies. 4. Stable postsurgical changes in the proximal tibia with underlying moderate tricompartmental degenerative changes at the left knee. Electronically Signed   By: Elsie Perone M.D.   On: 03/08/2024 16:56   CT FOOT LEFT W CONTRAST Result Date: 03/08/2024 CLINICAL DATA:  Soft tissue infection suspected, lower leg, xray done. Lateral wound in the proximal lower leg with erythema extending into the distal thigh. EXAM: CT OF THE LOWER LEFT EXTREMITY WITH CONTRAST TECHNIQUE: Multidetector CT imaging of the lower left extremity was performed according to the standard protocol following intravenous contrast administration. This examination is a large field of view survey with images extending from the level of the  femoral head into the foot. Portions of the distal forefoot  are incompletely visualized. RADIATION DOSE REDUCTION: This exam was performed according to the departmental dose-optimization program which includes automated exposure control, adjustment of the mA and/or kV according to patient size and/or use of iterative reconstruction technique. CONTRAST:  OMNIPAQUE  IOHEXOL  300 MG/ML SOLN, OMNIPAQUE  IOHEXOL  300 MG/ML SOLN COMPARISON:  None recent.  Left knee radiographs 10/16/2007. FINDINGS: Bones/Joint/Cartilage No evidence of acute fracture, dislocation or osteomyelitis. Stable postsurgical changes in the proximal tibia with 2 laterally inserted screws in the tibial plateau. Underlying moderate tricompartmental degenerative changes at the left knee. There is a small knee joint effusion. No significant arthropathy or effusion at the left ankle. The left hip joint is incompletely visualized. Ligaments Suboptimally assessed by CT. Muscles and Tendons No intramuscular fluid collection, suspicious enhancement or focal atrophy demonstrated. The quadriceps and patellar tendons are intact. The ankle tendons appear intact as evaluated by CT, without significant tenosynovitis. Soft tissues Mild-to-moderate nonspecific circumferential skin thickening and subcutaneous edema within the lower leg and distal thigh. There is distal extension into the dorsal aspect of the foot. Possible mild skin ulceration anteriorly in the lower leg. No focal fluid collection, soft tissue emphysema or unexpected foreign body identified. No acute vascular findings are seen. Prominent left inguinal lymph nodes measuring up to 2.3 cm transverse are likely reactive in this clinical context. Incidentally noted are small bilateral scrotal hydroceles and a Foley catheter. IMPRESSION: 1. Mild-to-moderate nonspecific circumferential skin thickening and subcutaneous edema within the lower leg and distal thigh, suspicious for cellulitis. No focal fluid collection, soft tissue emphysema or unexpected  foreign body identified. 2. No evidence of osteomyelitis or septic joint. 3. Prominent left inguinal lymph nodes, likely reactive in this clinical context. Correlate clinically to exclude other etiologies. 4. Stable postsurgical changes in the proximal tibia with underlying moderate tricompartmental degenerative changes at the left knee. Electronically Signed   By: Elsie Perone M.D.   On: 03/08/2024 16:56   CT CHEST ABDOMEN PELVIS W CONTRAST Result Date: 03/08/2024 CLINICAL DATA:  Found down, trauma EXAM: CT CHEST, ABDOMEN, AND PELVIS WITH CONTRAST TECHNIQUE: Multidetector CT imaging of the chest, abdomen and pelvis was performed following the standard protocol during bolus administration of intravenous contrast. RADIATION DOSE REDUCTION: This exam was performed according to the departmental dose-optimization program which includes automated exposure control, adjustment of the mA and/or kV according to patient size and/or use of iterative reconstruction technique. CONTRAST:  OMNIPAQUE  IOHEXOL  300 MG/ML SOLN, OMNIPAQUE  IOHEXOL  300 MG/ML SOLN COMPARISON:  05/21/2023 FINDINGS: CT CHEST FINDINGS Cardiovascular: The heart is enlarged without pericardial effusion. No evidence of vascular injury. No evidence of thoracic aortic aneurysm or dissection. Atherosclerosis of the aorta and coronary vasculature. Mediastinum/Nodes: Left supraclavicular lymphadenopathy measuring up to 16 mm in short axis reference image 7/6. No axillary, mediastinal, or hilar adenopathy. Thyroid gland, trachea, and esophagus demonstrate no significant findings. Lungs/Pleura: Bilateral dependent hypoventilatory changes. No acute airspace disease, effusion, or pneumothorax. The central airways are patent. Musculoskeletal: There are no acute displaced fractures. Reconstructed images demonstrate no additional findings. CT ABDOMEN PELVIS FINDINGS Hepatobiliary: No hepatic injury or perihepatic hematoma. Gallbladder is unremarkable.  Pancreas: Unremarkable. No pancreatic ductal dilatation or surrounding inflammatory changes. Spleen: The spleen is enlarged, measuring 13.6 x 9.2 x 17.4 cm. No evidence of splenic injury. Adrenals/Urinary Tract: No adrenal hemorrhage or renal injury identified. Bladder is decompressed with a Foley catheter. Stomach/Bowel: No bowel obstruction or ileus. No bowel wall thickening or  inflammatory change. Vascular/Lymphatic: Atherosclerosis of the aorta and iliac vessels. Retroperitoneal and inguinal lymphadenopathy. Largest lymph node in the left inguinal region measures 22 mm in short axis, reference image 137/6. Left external iliac node measures 13 mm in short axis reference image 115/6. Multiple borderline enlarged retroperitoneal nodes are seen with largest measuring 10 mm in the left para-aortic region reference image 71/6. There is retroperitoneal fat stranding extending along the left pelvic sidewall, likely related to lymphadenopathy. Reproductive: Prostate is unremarkable. Other: No free fluid or free intraperitoneal gas. No abdominal wall hernia. Musculoskeletal: No acute or destructive bony abnormalities. Bilateral L5 spondylolysis without spondylolisthesis. Reconstructed images demonstrate no additional findings. IMPRESSION: 1. No evidence of acute intrathoracic, intra-abdominal, or intrapelvic trauma. 2. Splenomegaly, with left supraclavicular, retroperitoneal, and left inguinal regions as above. Findings are concerning for underlying lymphoproliferative disorder. 3. Cardiomegaly. 4.  Aortic Atherosclerosis (ICD10-I70.0). Electronically Signed   By: Ozell Daring M.D.   On: 03/08/2024 16:54   CT T-SPINE NO CHARGE Result Date: 03/08/2024 CLINICAL DATA:  Found down EXAM: CT Thoracic and Lumbar spine with contrast TECHNIQUE: Multiplanar CT images of the thoracic and lumbar spine were reconstructed from contemporary CT of the Chest, Abdomen, and Pelvis. RADIATION DOSE REDUCTION: This exam was performed  according to the departmental dose-optimization program which includes automated exposure control, adjustment of the mA and/or kV according to patient size and/or use of iterative reconstruction technique. CONTRAST:  No additional COMPARISON:  05/21/2023 FINDINGS: CT THORACIC SPINE FINDINGS Alignment: Alignment is grossly anatomic. Vertebrae: No acute fracture or focal pathologic process. Paraspinal and other soft tissues: The paraspinal soft tissues are unremarkable. Please refer to dedicated chest CT for intrathoracic findings. Disc levels: Diffuse multilevel thoracic spondylosis is unchanged, without bony encroachment upon the central canal or neural foramina. Reconstructed images demonstrate no additional findings. CT LUMBAR SPINE FINDINGS Segmentation: 5 lumbar type vertebrae. Alignment: Alignment is grossly anatomic. Vertebrae: Bilateral L5 spondylolysis without evidence of spondylolisthesis. No acute displaced fractures. No destructive bony lesions. Paraspinal and other soft tissues: Paraspinal soft tissues are unremarkable. Please refer to CT abdomen and pelvis exam for intra-abdominal and intrapelvic findings. Disc levels: Mild broad-based disc bulges are seen at L1-2, L2-3, L3-4, without significant compressive sequela. At L4-5 there is mild circumferential disc bulge and bilateral facet hypertrophy resulting in mild symmetrical neural foraminal encroachment. At L5-S1 there is circumferential disc bulge and prominent bilateral facet hypertrophy which results in significant bilateral symmetrical neural foraminal encroachment. Reconstructed images demonstrate no additional findings. IMPRESSION: 1. No acute thoracic or lumbar spine fractures. 2. Mild diffuse thoracic spondylosis without significant compressive sequela. 3. Multilevel lumbar degenerative changes, greatest at L5-S1. 4. Bilateral L5 spondylolysis without spondylolisthesis. Electronically Signed   By: Ozell Daring M.D.   On: 03/08/2024 16:45    CT L-SPINE NO CHARGE Result Date: 03/08/2024 CLINICAL DATA:  Found down EXAM: CT Thoracic and Lumbar spine with contrast TECHNIQUE: Multiplanar CT images of the thoracic and lumbar spine were reconstructed from contemporary CT of the Chest, Abdomen, and Pelvis. RADIATION DOSE REDUCTION: This exam was performed according to the departmental dose-optimization program which includes automated exposure control, adjustment of the mA and/or kV according to patient size and/or use of iterative reconstruction technique. CONTRAST:  No additional COMPARISON:  05/21/2023 FINDINGS: CT THORACIC SPINE FINDINGS Alignment: Alignment is grossly anatomic. Vertebrae: No acute fracture or focal pathologic process. Paraspinal and other soft tissues: The paraspinal soft tissues are unremarkable. Please refer to dedicated chest CT for intrathoracic findings. Disc levels: Diffuse multilevel thoracic  spondylosis is unchanged, without bony encroachment upon the central canal or neural foramina. Reconstructed images demonstrate no additional findings. CT LUMBAR SPINE FINDINGS Segmentation: 5 lumbar type vertebrae. Alignment: Alignment is grossly anatomic. Vertebrae: Bilateral L5 spondylolysis without evidence of spondylolisthesis. No acute displaced fractures. No destructive bony lesions. Paraspinal and other soft tissues: Paraspinal soft tissues are unremarkable. Please refer to CT abdomen and pelvis exam for intra-abdominal and intrapelvic findings. Disc levels: Mild broad-based disc bulges are seen at L1-2, L2-3, L3-4, without significant compressive sequela. At L4-5 there is mild circumferential disc bulge and bilateral facet hypertrophy resulting in mild symmetrical neural foraminal encroachment. At L5-S1 there is circumferential disc bulge and prominent bilateral facet hypertrophy which results in significant bilateral symmetrical neural foraminal encroachment. Reconstructed images demonstrate no additional findings. IMPRESSION: 1.  No acute thoracic or lumbar spine fractures. 2. Mild diffuse thoracic spondylosis without significant compressive sequela. 3. Multilevel lumbar degenerative changes, greatest at L5-S1. 4. Bilateral L5 spondylolysis without spondylolisthesis. Electronically Signed   By: Ozell Daring M.D.   On: 03/08/2024 16:45   CT Cervical Spine Wo Contrast Result Date: 03/08/2024 EXAM: CT CERVICAL SPINE WITHOUT CONTRAST 03/08/2024 04:30:12 PM TECHNIQUE: CT of the cervical spine was performed without the administration of intravenous contrast. Multiplanar reformatted images are provided for review. Automated exposure control, iterative reconstruction, and/or weight based adjustment of the mA/kV was utilized to reduce the radiation dose to as low as reasonably achievable. COMPARISON: None available. CLINICAL HISTORY: Neck trauma, intoxicated or obtunded (Age >= 16y). Neck trauma; intoxicated or obtunded; age >= 16 years. FINDINGS: BONES AND ALIGNMENT: Straightening of the normal cervical lordosis. No evidence of traumatic malalignment. DEGENERATIVE CHANGES: Disc space narrowing and degenerative endplate osteophytes at multiple levels. The cervical spinal canal appears congenitally narrowed with additional spinal canal narrowing secondary to disc osteophyte complexes. There is facet arthrosis and uncovertebral hypertrophy at multiple levels. There is significant foraminal stenosis at multiple levels throughout the cervical spine, overall most pronounced at the C4-C5 level. Recommended correlation with symptoms of cervical radiculopathy and consider nonemergent MRI of the cervical spine for further evaluation if clinically indicated. SOFT TISSUES: No prevertebral soft tissue swelling. IMPRESSION: 1. No evidence of acute traumatic injury. 2. 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 Consider nonemergent MRI of the cervical spine for further evaluation if  radicular symptoms are present. Electronically signed by: Donnice Mania MD 03/08/2024 04:44 PM EST RP Workstation: [id number]   CT Head Wo Contrast Result Date: 03/08/2024 EXAM: CT HEAD WITHOUT CONTRAST 03/08/2024 04:30:12 PM TECHNIQUE: CT of the head was performed without the administration of intravenous contrast. Automated exposure control, iterative reconstruction, and/or weight based adjustment of the mA/kV was utilized to reduce the radiation dose to as low as reasonably achievable. COMPARISON: 06/28/2019 CLINICAL HISTORY: Head trauma with abnormal mental status (Age 32-64y). FINDINGS: BRAIN AND VENTRICLES: No acute hemorrhage. No evidence of acute infarct. No hydrocephalus. No extra-axial collection. No mass effect or midline shift. ORBITS: No acute abnormality. SINUSES: Mucosal thickening in the partially visualized left maxillary sinus. Mucosal thickening versus thin septations in the left sphenoid sinus. Calcification in the left anterior ethmoid air cells likely reflecting an osteoma. There is adjacent mucosal thickening. SOFT TISSUES AND SKULL: Chronic bilateral nasal bone fractures. No acute soft tissue abnormality. IMPRESSION: 1. No acute intracranial abnormality. 2. Chronic bilateral nasal bone fractures. Electronically signed by: Donnice Mania MD 03/08/2024 04:39 PM EST RP Workstation: [id number]   DG Pelvis Portable Result Date:  03/08/2024 EXAM: 1 OR 2 VIEW(S) XRAY OF THE PELVIS 03/08/2024 12:59:00 PM COMPARISON: CT 06/28/2019. CLINICAL HISTORY: Altered mental status; trauma. FINDINGS: BONES AND JOINTS: No acute fracture. No malalignment. Mild degenerative changes of the left hip joint with mild joint space narrowing and osteophytosis of the superior acetabulum. SOFT TISSUES: Unremarkable. IMPRESSION: 1. No evidence of acute traumatic injury. Electronically signed by: Katheleen Faes MD 03/08/2024 01:19 PM EST RP Workstation: [id number]   DG Chest Portable 1 View Result Date: 03/08/2024 EXAM: 1  VIEW(S) XRAY OF THE CHEST 03/08/2024 12:59:00 PM COMPARISON: 05/22/2023 CLINICAL HISTORY: Altered mental status; trauma. FINDINGS: LINES, TUBES AND DEVICES: Interval extubation and removal of gastric tube. LUNGS AND PLEURA: Low lung volumes. Bibasilar airspace opacities, favored to represent atelectasis. Query mild central pulmonary vascular congestion. No pleural effusion. No pneumothorax. HEART AND MEDIASTINUM: No acute abnormality of the cardiac and mediastinal silhouettes. BONES AND SOFT TISSUES: No acute osseous abnormality. IMPRESSION: 1. Low lung volumes with bibasilar airspace opacities, favored to represent atelectasis. 2. Possible mild central pulmonary vascular congestion. Electronically signed by: Dayne Hassell MD 03/08/2024 01:18 PM EST RP Workstation: [id number]        Scheduled Meds:  Chlorhexidine  Gluconate Cloth  6 each Topical Daily   enoxaparin  (LOVENOX ) injection  40 mg Subcutaneous Q24H   fluticasone  furoate-vilanterol  1 puff Inhalation Daily   [START ON 03/11/2024] methadone   100 mg Oral Daily   mupirocin  ointment   Nasal BID   pantoprazole   40 mg Oral Daily   Continuous Infusions:  penicillin  G potassium 24 Million Units in dextrose  5 % 500 mL CONTINUOUS infusion       LOS: 2 days     CRITICAL CARE Performed by: Harlene RAYMOND Bowl   Total critical care time: 65 minutes  Critical care time was exclusive of separately billable procedures and treating other patients.  Critical care was necessary to treat or prevent imminent or life-threatening deterioration.  Critical care was time spent personally by me on the following activities: development of treatment plan with patient and/or surrogate as well as nursing, discussions with consultants, evaluation of patient's response to treatment, examination of patient, obtaining history from patient or surrogate, ordering and performing treatments and interventions, ordering and review of laboratory studies, ordering and review  of radiographic studies, pulse oximetry and re-evaluation of patient's condition.   Harlene RAYMOND Bowl, DO [facility] Available via Epic secure chat 7am-7pm After these hours, please refer to coverage provider listed on [email or web address] 03/10/2024, 12:28 PM   "
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
## 2024-03-10 NOTE — Progress Notes (Signed)
 Pt still hasn't voided since foley removal. did another bladder scan on pt and it shows 339 ml on bladder scan. Notified Harlene Bowl, DO.

## 2024-03-10 NOTE — Plan of Care (Signed)

## 2024-03-11 LAB — BASIC METABOLIC PANEL WITH GFR
Anion gap: 7 (ref 5–15)
BUN: 23 mg/dL — ABNORMAL HIGH (ref 6–20)
CO2: 36 mmol/L — ABNORMAL HIGH (ref 22–32)
Calcium: 8.1 mg/dL — ABNORMAL LOW (ref 8.9–10.3)
Chloride: 98 mmol/L (ref 98–111)
Creatinine, Ser: 0.77 mg/dL (ref 0.61–1.24)
GFR, Estimated: >60 mL/min
Glucose, Bld: 112 mg/dL — ABNORMAL HIGH (ref 70–99)
Potassium: 4 mmol/L (ref 3.5–5.1)
Sodium: 141 mmol/L (ref 135–145)

## 2024-03-11 LAB — GLUCOSE, CAPILLARY
Glucose-Capillary: 106 mg/dL — ABNORMAL HIGH (ref 70–99)
Glucose-Capillary: 108 mg/dL — ABNORMAL HIGH (ref 70–99)
Glucose-Capillary: 118 mg/dL — ABNORMAL HIGH (ref 70–99)
Glucose-Capillary: 123 mg/dL — ABNORMAL HIGH (ref 70–99)

## 2024-03-11 LAB — CBC
HCT: 34.2 % — ABNORMAL LOW (ref 39.0–52.0)
Hemoglobin: 9.2 g/dL — ABNORMAL LOW (ref 13.0–17.0)
MCH: 24.2 pg — ABNORMAL LOW (ref 26.0–34.0)
MCHC: 26.9 g/dL — ABNORMAL LOW (ref 30.0–36.0)
MCV: 90 fL (ref 80.0–100.0)
Platelets: 136 10*3/uL — ABNORMAL LOW (ref 150–400)
RBC: 3.8 MIL/uL — ABNORMAL LOW (ref 4.22–5.81)
RDW: 18.5 % — ABNORMAL HIGH (ref 11.5–15.5)
WBC: 6.3 10*3/uL (ref 4.0–10.5)
nRBC: 0 % (ref 0.0–0.2)

## 2024-03-11 LAB — CULTURE, BLOOD (ROUTINE X 2)
Culture: NO GROWTH
Culture: NO GROWTH
Special Requests: ADEQUATE
Special Requests: ADEQUATE
Special Requests: ADEQUATE
Special Requests: ADEQUATE

## 2024-03-11 LAB — C-REACTIVE PROTEIN: CRP: 14.2 mg/dL — ABNORMAL HIGH

## 2024-03-11 LAB — HEPATITIS B SURFACE ANTIGEN: Hepatitis B Surface Ag: NONREACTIVE

## 2024-03-11 LAB — SEDIMENTATION RATE: Sed Rate: 66 mm/h — ABNORMAL HIGH (ref 0–20)

## 2024-03-11 LAB — LACTATE DEHYDROGENASE: LDH: 436 U/L — ABNORMAL HIGH (ref 105–235)

## 2024-03-11 LAB — SYPHILIS: RPR W/REFLEX TO RPR TITER AND TREPONEMAL ANTIBODIES, TRADITIONAL SCREENING AND DIAGNOSIS ALGORITHM: RPR Ser Ql: NONREACTIVE

## 2024-03-11 LAB — HEPATITIS A ANTIBODY, TOTAL: hep A Total Ab: NONREACTIVE

## 2024-03-11 LAB — HIV ANTIBODY (ROUTINE TESTING W REFLEX): HIV Screen 4th Generation wRfx: NONREACTIVE

## 2024-03-11 MED ORDER — FUROSEMIDE 10 MG/ML IJ SOLN
20.0000 mg | Freq: Two times a day (BID) | INTRAMUSCULAR | Status: AC
  Administered 2024-03-11: 20 mg via INTRAVENOUS
  Filled 2024-03-11: qty 2

## 2024-03-11 MED ORDER — FUROSEMIDE 10 MG/ML IJ SOLN
20.0000 mg | Freq: Once | INTRAMUSCULAR | Status: AC
  Administered 2024-03-11: 20 mg via INTRAVENOUS
  Filled 2024-03-11: qty 2

## 2024-03-11 NOTE — Plan of Care (Signed)
" °  Problem: Education: Goal: Knowledge of General Education information will improve Description: Including pain rating scale, medication(s)/side effects and non-pharmacologic comfort measures Outcome: Progressing   Problem: Clinical Measurements: Goal: Diagnostic test results will improve Outcome: Progressing   Problem: Nutrition: Goal: Adequate nutrition will be maintained Outcome: Progressing   Problem: Safety: Goal: Ability to remain free from injury will improve Outcome: Progressing   Problem: Activity: Goal: Risk for activity intolerance will decrease Outcome: Not Progressing   Problem: Pain Managment: Goal: General experience of comfort will improve and/or be controlled Outcome: Not Progressing   "

## 2024-03-11 NOTE — TOC Progression Note (Signed)
 Transition of Care [facility]) - Progression Note    Patient Details  Name: Nathaniel [name] MRN: [id number] Date of Birth: 06/17/1970  [facility] CM/SW Contact  Hoy DELENA Bigness, LCSW Phone Number: 03/11/2024, 2:36 PM  Clinical Narrative:    Pt agreeable to recommendation for HHPT. Pt denies having HH in the past. Referrals sent out in the HUB. Pt reports having PCP w/ [facility] in [place] that can sign HH orders.                      Expected Discharge Plan and Services                                               Social Drivers of Health (SDOH) Interventions SDOH Screenings   Food Insecurity: Food Insecurity Present (03/09/2024)  Housing: Low Risk (03/09/2024)  Transportation Needs: No Transportation Needs (03/09/2024)  Utilities: Not At Risk (03/09/2024)  Social Connections: Unknown (06/14/2021)   Received from [facility]  Tobacco Use: Low Risk (03/08/2024)    Readmission Risk Interventions    03/09/2024   10:31 AM  Readmission Risk Prevention Plan  Transportation Screening Complete  PCP or Specialist Appt within 5-7 Days Not Complete  Not Complete comments PCP list added to AVS  Home Care Screening Complete  Medication Review (RN CM) Complete

## 2024-03-11 NOTE — Progress Notes (Signed)
 " PROGRESS NOTE    Nathaniel [name]  FMW:[id number] DOB: 09-23-1970 DOA: 03/08/2024 PCP: Freddrick, No    Brief Narrative:  Nathaniel [name] is a 54 y.o. male with medical history significant for COPD, morbid obesity, heroin use disorder.  Patient was brought to the ED via EMS for reports of altered mental status.  Patient was found lying facedown on the floor by his brother.  It is unknown how long patient had been on the floor.  Found to have a lower extremity cellulitis.     Assessment and Plan: Severe sepsis 2/2 left lower extremity cellulitis-meeting severe sepsis criteria with tachycardia heart rate 91-112, leukocytosis of 15.1, with evidence of endorgan dysfunction hypoxia, encephalopathy and lactic acidosis of 2.5 >> 1.5.   -Etiology likely left lower extremity cellulitis - Baseline chronic lymphedema with induration.   -elevate extremity  -CT left lower extremity-suggest cellulitis, no fluid collection or foreign body identified.  - IV abx - Follow-up blood cultures- gram + cocci- strep G (ID consult for help with duration/type of abx:When he nears DC would change him to 2x 500mg  = 1000mg  Amoxicillin to complete at total of 2 weeks of effective antibiotics  ) -repeat Blood cultures-no growth today -monitor in SDU  Rhabdo - CK trending down  Acute hypoxic respiratory failure-O2 sats down to 79% on room air, initially placed on 15 L nonrebreather then switched to nasal cannula.  CT chest with contrast without acute chest abnormality.  Patient's responded to 0.4 mg of Narcan .  History of heroin use.  On methadone . -wean O2 as able -IS -Suspect some volume overload so we will start 20 mg IV Lasix  twice daily for now and monitor   Acute metabolic encephalopathy - Likely secondary to severe sepsis, cellulitis, hypoxia, and possibly narcotic use.  Patient found facedown on the floor by his brother.  Head CT negative for acute abnormality.  Cervical spine CT shows significant multilevel foraminal  stenosis nonemergent MRI recommended, if radicular symptoms present. - Will hold on methadone  as this seems to have worsened his mental status   Chronic pain - Attempted to start patient back on his home dose of methadone  but he became very sleepy - Will hold further doses of methadone  for now   Hypokalemia, hyponatremia-potassium 2.6.  Sodium 133. - repleted   COPD-no wheezing on exam. - DuoNebs as needed   H/o Heroin abuse   Lymphadenopathy-CT findings chest abdomen pelvis with contrast-  Splenomegaly, with left supraclavicular, retroperitoneal, and left inguinal regions as above. Findings are concerning for underlying lymphoproliferative disorder. - Follow-up as outpatient   Severe obesity Estimated body mass index is 43.35 kg/m as calculated from the following:   Height as of this encounter: 6' (1.829 m).   Weight as of this encounter: 145 kg.   Suspected sleep apnea Will need outpatient sleep study - Will use CPAP/BiPAP while in the hospital   DC Foley  DVT prophylaxis: enoxaparin  (LOVENOX ) injection 40 mg Start: 03/08/24 2130    Code Status: Full Code Family Communication: None at bedside  Disposition Plan:  Level of care: Stepdown Status is: Inpatient       Subjective: Still seems altered from prior methadone  dose Does not think he will be able to tolerate CPAP at home  Objective: Vitals:   03/11/24 0819 03/11/24 1000 03/11/24 1148 03/11/24 1154  BP:  (!) 107/52  124/71  Pulse:  87  81  Resp:  20    Temp:   97.6 F (36.4 C)  TempSrc:   Axillary   SpO2: 99% 100%  92%  Weight:      Height:        Intake/Output Summary (Last 24 hours) at 03/11/2024 1358 Last data filed at 03/11/2024 1244 Gross per 24 hour  Intake 275.16 ml  Output 1600 ml  Net -1324.84 ml   Filed Weights   03/08/24 1228  Weight: (!) 145 kg    Examination:   General: Appearance:    Severely obese male in no acute distress      Lungs:     On Craig, respirations unlabored   Heart:    Normal heart rate.    MS:   All extremities are intact.    Neurologic:   Awake, alert       Data Reviewed: I have personally reviewed following labs and imaging studies  CBC: Recent Labs  Lab 03/08/24 1240 03/09/24 0245 03/10/24 0456 03/11/24 0450  WBC 15.1* 10.1 8.7 6.3  NEUTROABS 13.6*  --   --   --   HGB 12.1* 10.8* 9.7* 9.2*  HCT 41.1 37.0* 34.6* 34.2*  MCV 83.5 83.9 87.2 90.0  PLT 163 137* 133* 136*   Basic Metabolic Panel: Recent Labs  Lab 03/08/24 1240 03/08/24 1551 03/09/24 0245 03/10/24 0456 03/11/24 0450  NA 133*  --  136 137 141  K 2.6*  --  3.1* 3.6 4.0  CL 88*  --  94* 96* 98  CO2 28  --  32 29 36*  GLUCOSE 148*  --  119* 127* 112*  BUN 19  --  19 24* 23*  CREATININE 1.13  --  0.85 0.80 0.77  CALCIUM 8.6*  --  7.8* 8.0* 8.1*  MG  --  2.0  --   --   --    GFR: Estimated Creatinine Clearance: 158 mL/min (by C-G formula based on SCr of 0.77 mg/dL). Liver Function Tests: Recent Labs  Lab 03/08/24 1240  AST 57*  ALT 18  ALKPHOS 82  BILITOT 1.1  PROT 7.2  ALBUMIN 3.7   No results for input(s): LIPASE, AMYLASE in the last 168 hours. No results for input(s): AMMONIA in the last 168 hours. Coagulation Profile: Recent Labs  Lab 03/08/24 1240  INR 1.1   Cardiac Enzymes: Recent Labs  Lab 03/08/24 1551 03/10/24 0456  CKTOTAL 2,072* 782*   BNP (last 3 results) No results for input(s): PROBNP in the last 8760 hours. HbA1C: No results for input(s): HGBA1C in the last 72 hours. CBG: Recent Labs  Lab 03/09/24 2333 03/10/24 0731 03/10/24 1614 03/11/24 0001 03/11/24 0744  GLUCAP 143* 138* 135* 123* 118*   Lipid Profile: No results for input(s): CHOL, HDL, LDLCALC, TRIG, CHOLHDL, LDLDIRECT in the last 72 hours. Thyroid Function Tests: No results for input(s): TSH, T4TOTAL, FREET4, T3FREE, THYROIDAB in the last 72 hours. Anemia Panel: No results for input(s): VITAMINB12, FOLATE,  FERRITIN, TIBC, IRON, RETICCTPCT in the last 72 hours. Sepsis Labs: Recent Labs  Lab 03/08/24 1240 03/08/24 1551  LATICACIDVEN 2.5* 1.5    Recent Results (from the past 240 hours)  Blood culture (routine x 2)     Status: Abnormal   Collection Time: 03/08/24 12:40 PM   Specimen: BLOOD  Result Value Ref Range Status   Specimen Description   Final    BLOOD BLOOD RIGHT ARM Performed at [facility], [address], [place], [place] [address]    Special Requests   Final    Blood Culture adequate volume BOTTLES DRAWN AEROBIC  AND ANAEROBIC Performed at [facility], [address], [place], [place] [address]    Culture  Setup Time   Final    GRAM POSITIVE COCCI IN PAIRS AND CHAINS IN BOTH AEROBIC AND ANAEROBIC BOTTLES CRITICAL VALUE NOTED.  VALUE IS CONSISTENT WITH PREVIOUSLY REPORTED AND CALLED VALUE.    Culture (A)  Final    STREPTOCOCCUS GROUP G SUSCEPTIBILITIES PERFORMED ON PREVIOUS CULTURE WITHIN THE LAST 5 DAYS. Performed at [facility], [address], [place], [place] [address]    Report Status 03/11/2024 FINAL  Final  Blood culture (routine x 2)     Status: Abnormal   Collection Time: 03/08/24 12:41 PM   Specimen: BLOOD  Result Value Ref Range Status   Specimen Description   Final    BLOOD BLOOD LEFT ARM Performed at [facility], [address] [place], [place] [address]    Special Requests   Final    BOTTLES DRAWN AEROBIC AND ANAEROBIC Blood Culture adequate volume Performed at [facility], [address], [place], [place] [address]    Culture  Setup Time   Final    GRAM POSITIVE COCCI IN PAIRS AND CHAINS IN BOTH AEROBIC AND ANAEROBIC BOTTLES Gram Stain Report Called to,Read Back By and Verified With: R THOMPSON,RN@0015  03/09/24 MK CRITICAL RESULT CALLED TO, READ BACK BY AND VERIFIED WITH: COURTNEY P FLOOR RN  03/09/2024 BY DD @ 0505    Culture (A)  Final    STREPTOCOCCUS GROUP G STAPHYLOCOCCUS EPIDERMIDIS THE SIGNIFICANCE OF ISOLATING THIS  ORGANISM FROM A SINGLE SET OF BLOOD CULTURES WHEN MULTIPLE SETS ARE DRAWN IS UNCERTAIN. PLEASE NOTIFY THE MICROBIOLOGY DEPARTMENT WITHIN ONE WEEK IF SPECIATION AND SENSITIVITIES ARE REQUIRED. Performed at [facility], [address], [place], [place] [address]    Report Status 03/11/2024 FINAL  Final   Organism ID, Bacteria STREPTOCOCCUS GROUP G  Final      Susceptibility   Streptococcus group g - MIC*    CLINDAMYCIN >=1 RESISTANT Resistant     AMPICILLIN <=0.25 SENSITIVE Sensitive     ERYTHROMYCIN >=8 RESISTANT Resistant     VANCOMYCIN  0.5 SENSITIVE Sensitive     CEFTRIAXONE  <=0.12 SENSITIVE Sensitive     LEVOFLOXACIN 0.5 SENSITIVE Sensitive     PENICILLIN  <=0.06 SENSITIVE Sensitive     * STREPTOCOCCUS GROUP G  Blood Culture ID Panel (Reflexed)     Status: Abnormal   Collection Time: 03/08/24 12:41 PM  Result Value Ref Range Status   Enterococcus faecalis NOT DETECTED NOT DETECTED Final   Enterococcus Faecium NOT DETECTED NOT DETECTED Final   Listeria monocytogenes NOT DETECTED NOT DETECTED Final   Staphylococcus species DETECTED (A) NOT DETECTED Final    Comment: CRITICAL RESULT CALLED TO, READ BACK BY AND VERIFIED WITH: COURTNEY P FLOOR RN  03/09/2024 BY DD @ 0505    Staphylococcus aureus (BCID) NOT DETECTED NOT DETECTED Final   Staphylococcus epidermidis DETECTED (A) NOT DETECTED Final    Comment: Methicillin (oxacillin) resistant coagulase negative staphylococcus. Possible blood culture contaminant (unless isolated from more than one blood culture draw or clinical case suggests pathogenicity). No antibiotic treatment is indicated for blood  culture contaminants. CRITICAL RESULT CALLED TO, READ BACK BY AND VERIFIED WITH: COURTNEY P FLOOR RN  03/09/2024 BY DD @ 0505    Staphylococcus lugdunensis NOT DETECTED NOT DETECTED Final   Streptococcus species DETECTED (A) NOT DETECTED Final    Comment: Not Enterococcus species, Streptococcus agalactiae, Streptococcus pyogenes, or  Streptococcus pneumoniae. CRITICAL RESULT CALLED  TO, READ BACK BY AND VERIFIED WITH: COURTNEY P FLOOR RN  03/09/2024 BY DD @ 0505    Streptococcus agalactiae NOT DETECTED NOT DETECTED Final   Streptococcus pneumoniae NOT DETECTED NOT DETECTED Final   Streptococcus pyogenes NOT DETECTED NOT DETECTED Final   A.calcoaceticus-baumannii NOT DETECTED NOT DETECTED Final   Bacteroides fragilis NOT DETECTED NOT DETECTED Final   Enterobacterales NOT DETECTED NOT DETECTED Final   Enterobacter cloacae complex NOT DETECTED NOT DETECTED Final   Escherichia coli NOT DETECTED NOT DETECTED Final   Klebsiella aerogenes NOT DETECTED NOT DETECTED Final   Klebsiella oxytoca NOT DETECTED NOT DETECTED Final   Klebsiella pneumoniae NOT DETECTED NOT DETECTED Final   Proteus species NOT DETECTED NOT DETECTED Final   Salmonella species NOT DETECTED NOT DETECTED Final   Serratia marcescens NOT DETECTED NOT DETECTED Final   Haemophilus influenzae NOT DETECTED NOT DETECTED Final   Neisseria meningitidis NOT DETECTED NOT DETECTED Final   Pseudomonas aeruginosa NOT DETECTED NOT DETECTED Final   Stenotrophomonas maltophilia NOT DETECTED NOT DETECTED Final   Candida albicans NOT DETECTED NOT DETECTED Final   Candida auris NOT DETECTED NOT DETECTED Final   Candida glabrata NOT DETECTED NOT DETECTED Final   Candida krusei NOT DETECTED NOT DETECTED Final   Candida parapsilosis NOT DETECTED NOT DETECTED Final   Candida tropicalis NOT DETECTED NOT DETECTED Final   Cryptococcus neoformans/gattii NOT DETECTED NOT DETECTED Final   Methicillin resistance mecA/C DETECTED (A) NOT DETECTED Final    Comment: CRITICAL RESULT CALLED TO, READ BACK BY AND VERIFIED WITH: COURTNEY P FLOOR RN  03/09/2024 BY DD @ 0505 Performed at [facility], [address], [place], [place] [address]   Urine Culture (for pregnant, neutropenic or urologic patients or patients with an indwelling urinary catheter)     Status: Abnormal   Collection  Time: 03/08/24 12:48 PM   Specimen: Urine, Clean Catch  Result Value Ref Range Status   Specimen Description   Final    URINE, CLEAN CATCH Performed at [facility], [address] [place], [place] [address]    Special Requests   Final    NONE Performed at [facility], [address] [place], [place] [address]    Culture (A)  Final    <10,000 COLONIES/mL INSIGNIFICANT GROWTH Performed at [facility], [address], [place], [place] [address]    Report Status 03/10/2024 FINAL  Final  Resp panel by RT-PCR (RSV, Flu A&B, Covid) Anterior Nasal Swab     Status: None   Collection Time: 03/08/24  1:56 PM   Specimen: Anterior Nasal Swab  Result Value Ref Range Status   SARS Coronavirus 2 by RT PCR NEGATIVE NEGATIVE Final    Comment: (NOTE) SARS-CoV-2 target nucleic acids are NOT DETECTED.  The SARS-CoV-2 RNA is generally detectable in upper respiratory specimens during the acute phase of infection. The lowest concentration of SARS-CoV-2 viral copies this assay can detect is 138 copies/mL. A negative result does not preclude SARS-Cov-2 infection and should not be used as the sole basis for treatment or other patient management decisions. A negative result may occur with  improper specimen collection/handling, submission of specimen other than nasopharyngeal swab, presence of viral mutation(s) within the areas targeted by this assay, and inadequate number of viral copies(<138 copies/mL). A negative result must be combined with clinical observations, patient history, and epidemiological information. The expected result is Negative.  Fact Sheet for Patients:  bloggercourse.com  Fact Sheet for Healthcare Providers:  seriousbroker.it  This test is no t yet approved or cleared by the [place]  FDA and  has been authorized for detection and/or diagnosis of SARS-CoV-2 by FDA under an Emergency Use Authorization (EUA). This EUA  will remain  in effect (meaning this test can be used) for the duration of the COVID-19 declaration under Section 564(b)(1) of the Act, 21 U.S.C.section 360bbb-3(b)(1), unless the authorization is terminated  or revoked sooner.       Influenza A by PCR NEGATIVE NEGATIVE Final   Influenza B by PCR NEGATIVE NEGATIVE Final    Comment: (NOTE) The Xpert Xpress SARS-CoV-2/FLU/RSV plus assay is intended as an aid in the diagnosis of influenza from Nasopharyngeal swab specimens and should not be used as a sole basis for treatment. Nasal washings and aspirates are unacceptable for Xpert Xpress SARS-CoV-2/FLU/RSV testing.  Fact Sheet for Patients: bloggercourse.com  Fact Sheet for Healthcare Providers: seriousbroker.it  This test is not yet approved or cleared by the [place]  FDA and has been authorized for detection and/or diagnosis of SARS-CoV-2 by FDA under an Emergency Use Authorization (EUA). This EUA will remain in effect (meaning this test can be used) for the duration of the COVID-19 declaration under Section 564(b)(1) of the Act, 21 U.S.C. section 360bbb-3(b)(1), unless the authorization is terminated or revoked.     Resp Syncytial Virus by PCR NEGATIVE NEGATIVE Final    Comment: (NOTE) Fact Sheet for Patients: bloggercourse.com  Fact Sheet for Healthcare Providers: seriousbroker.it  This test is not yet approved or cleared by the [place]  FDA and has been authorized for detection and/or diagnosis of SARS-CoV-2 by FDA under an Emergency Use Authorization (EUA). This EUA will remain in effect (meaning this test can be used) for the duration of the COVID-19 declaration under Section 564(b)(1) of the Act, 21 U.S.C. section 360bbb-3(b)(1), unless the authorization is terminated or revoked.  Performed at [facility], [address], [place], [place] [address]   MRSA  Next Gen by PCR, Nasal     Status: Abnormal   Collection Time: 03/09/24  3:17 PM   Specimen: Nasal Mucosa; Nasal Swab  Result Value Ref Range Status   MRSA by PCR Next Gen DETECTED (A) NOT DETECTED Final    Comment: RESULT CALLED TO, READ BACK BY AND VERIFIED WITH: CASSIDY COOK @ 1847 ON 03/09/24 C VARNER (NOTE) The GeneXpert MRSA Assay (FDA approved for NASAL specimens only), is one component of a comprehensive MRSA colonization surveillance program. It is not intended to diagnose MRSA infection nor to guide or monitor treatment for MRSA infections. Test performance is not FDA approved in patients less than 71 years old. Performed at [facility], [address] [place], [place] [address]   Culture, blood (Routine X 2) w Reflex to ID Panel     Status: None (Preliminary result)   Collection Time: 03/10/24  9:33 AM   Specimen: BLOOD  Result Value Ref Range Status   Specimen Description BLOOD BLOOD RIGHT HAND  Final   Special Requests   Final    Blood Culture adequate volume BOTTLES DRAWN AEROBIC AND ANAEROBIC   Culture   Final    NO GROWTH < 24 HOURS Performed at [facility], [address], [place], [place] [address]    Report Status PENDING  Incomplete  Culture, blood (Routine X 2) w Reflex to ID Panel     Status: None (Preliminary result)   Collection Time: 03/10/24  9:33 AM   Specimen: BLOOD  Result Value Ref Range Status  Specimen Description BLOOD BLOOD LEFT HAND  Final   Special Requests   Final    Blood Culture adequate volume BOTTLES DRAWN AEROBIC AND ANAEROBIC   Culture   Final    NO GROWTH < 24 HOURS Performed at [facility], [address] [place], [place] [address]    Report Status PENDING  Incomplete         Radiology Studies: ECHOCARDIOGRAM COMPLETE Result Date: 03/09/2024    ECHOCARDIOGRAM REPORT   Patient Name:   Nathaniel [name] Date of Exam: 03/09/2024 Medical Rec #:  [id number]     Height:       72.0 in Accession #:    [id number]    Weight:       319.7 lb  Date of Birth:  Jun 17, 1970     BSA:          2.600 m Patient Age:    53 years      BP:           134/84 mmHg Patient Gender: M             HR:           76 bpm. Exam Location:  [facility] Procedure: 2D Echo, Cardiac Doppler, Color Doppler and Intracardiac            Opacification Agent (Both Spectral and Color Flow Doppler were            utilized during procedure). Indications:    Bacteremia R78.81  History:        Patient has prior history of Echocardiogram examinations, most                 recent 05/22/2023. Risk Factors:Hypertension.  Sonographer:    Sydnee Wilson RDCS Referring Phys: [id number] U Mishelle Hassan  Sonographer Comments: Patient is obese. Image acquisition challenging due to patient body habitus. IMPRESSIONS  1. Poor acoustic windows for evaluation of infective endocarditis. Valves not well visualized. Consider TEE for further evaluation, if clincally indicated.  2. No LV thrombus by Definity . Left ventricular ejection fraction, by estimation, is 60 to 65%. The left ventricle has normal function. The left ventricle has no regional wall motion abnormalities. Left ventricular diastolic parameters were normal.  3. Right ventricular systolic function is normal. The right ventricular size is normal. Tricuspid regurgitation signal is inadequate for assessing PA pressure.  4. The mitral valve was not well visualized. No evidence of mitral valve regurgitation. No evidence of mitral stenosis.  5. The aortic valve was not well visualized. Aortic valve regurgitation is not visualized. No aortic stenosis is present.  6. The inferior vena cava is dilated in size with <50% respiratory variability, suggesting right atrial pressure of 15 mmHg. FINDINGS  Left Ventricle: No LV thrombus by Definity . Left ventricular ejection fraction, by estimation, is 60 to 65%. The left ventricle has normal function. The left ventricle has no regional wall motion abnormalities. Definity  contrast agent was given IV to delineate the left  ventricular endocardial borders. Strain was performed and the global longitudinal strain is indeterminate. The left ventricular internal cavity size was normal in size. There is no left ventricular hypertrophy. Left ventricular diastolic parameters were normal. Right Ventricle: The right ventricular size is normal. No increase in right ventricular wall thickness. Right ventricular systolic function is normal. Tricuspid regurgitation signal is inadequate for assessing PA pressure. Left Atrium: Left atrial size was normal in size. Right Atrium: Right atrial size was normal in size. Pericardium: There is no  evidence of pericardial effusion. Mitral Valve: The mitral valve was not well visualized. No evidence of mitral valve regurgitation. No evidence of mitral valve stenosis. Tricuspid Valve: The tricuspid valve is not well visualized. Tricuspid valve regurgitation is not demonstrated. No evidence of tricuspid stenosis. Aortic Valve: The aortic valve was not well visualized. Aortic valve regurgitation is not visualized. No aortic stenosis is present. Aortic valve mean gradient measures 5.0 mmHg. Aortic valve peak gradient measures 9.4 mmHg. Aortic valve area, by VTI measures 3.00 cm. Pulmonic Valve: The pulmonic valve was not well visualized. Pulmonic valve regurgitation is not visualized. No evidence of pulmonic stenosis. Aorta: The aortic root is normal in size and structure. Venous: The inferior vena cava is dilated in size with less than 50% respiratory variability, suggesting right atrial pressure of 15 mmHg. IAS/Shunts: No atrial level shunt detected by color flow Doppler. Additional Comments: 3D was performed not requiring image post processing on an independent workstation and was indeterminate.  LEFT VENTRICLE PLAX 2D LVIDd:         6.10 cm      Diastology LVIDs:         4.30 cm      LV e' medial:    11.40 cm/s LV PW:         1.10 cm      LV E/e' medial:  6.7 LV IVS:        1.10 cm      LV e' lateral:   14.10  cm/s LVOT diam:     2.20 cm      LV E/e' lateral: 5.4 LV SV:         84 LV SV Index:   32 LVOT Area:     3.80 cm  LV Volumes (MOD) LV vol d, MOD A2C: 229.0 ml LV vol d, MOD A4C: 294.0 ml LV vol s, MOD A2C: 64.3 ml LV vol s, MOD A4C: 99.5 ml LV SV MOD A2C:     164.7 ml LV SV MOD A4C:     294.0 ml LV SV MOD BP:      177.9 ml RIGHT VENTRICLE RV S prime:     15.60 cm/s TAPSE (M-mode): 1.7 cm LEFT ATRIUM             Index        RIGHT ATRIUM           Index LA diam:        4.00 cm 1.54 cm/m   RA Area:     13.80 cm LA Vol (A2C):   48.1 ml 18.50 ml/m  RA Volume:   31.00 ml  11.92 ml/m LA Vol (A4C):   50.0 ml 19.23 ml/m LA Biplane Vol: 49.5 ml 19.03 ml/m  AORTIC VALVE AV Area (Vmax):    2.56 cm AV Area (Vmean):   2.73 cm AV Area (VTI):     3.00 cm AV Vmax:           153.00 cm/s AV Vmean:          103.000 cm/s AV VTI:            0.281 m AV Peak Grad:      9.4 mmHg AV Mean Grad:      5.0 mmHg LVOT Vmax:         103.00 cm/s LVOT Vmean:        73.900 cm/s LVOT VTI:          0.222 m LVOT/AV VTI ratio: 0.79  AORTA Ao Root diam: 3.20 cm Ao Asc diam:  3.00 cm MITRAL VALVE MV Area (PHT): 3.34 cm    SHUNTS MV Decel Time: 227 msec    Systemic VTI:  0.22 m MV E velocity: 76.30 cm/s  Systemic Diam: 2.20 cm MV A velocity: 64.90 cm/s MV E/A ratio:  1.18 Vishnu Priya Mallipeddi Electronically signed by Diannah Late Mallipeddi Signature Date/Time: 03/09/2024/2:36:57 PM    Final         Scheduled Meds:  Chlorhexidine  Gluconate Cloth  6 each Topical Daily   enoxaparin  (LOVENOX ) injection  40 mg Subcutaneous Q24H   fluticasone  furoate-vilanterol  1 puff Inhalation Daily   mupirocin  ointment   Nasal BID   pantoprazole   40 mg Oral Daily   Continuous Infusions:  penicillin  G potassium 24 Million Units in dextrose  5 % 500 mL CONTINUOUS infusion 20.8 mL/hr at 03/11/24 1242     LOS: 3 days     CRITICAL CARE Performed by: Harlene RAYMOND Bowl   Total critical care time: 65 minutes  Critical care time was exclusive of  separately billable procedures and treating other patients.  Critical care was necessary to treat or prevent imminent or life-threatening deterioration.  Critical care was time spent personally by me on the following activities: development of treatment plan with patient and/or surrogate as well as nursing, discussions with consultants, evaluation of patient's response to treatment, examination of patient, obtaining history from patient or surrogate, ordering and performing treatments and interventions, ordering and review of laboratory studies, ordering and review of radiographic studies, pulse oximetry and re-evaluation of patient's condition.   Peja Allender U Kaaliyah Kita, DO [facility] Available via Epic secure chat 7am-7pm After these hours, please refer to coverage provider listed on [email or web address] 03/11/2024, 1:58 PM   "

## 2024-03-11 NOTE — Plan of Care (Signed)
" °  Problem: Acute Rehab PT Goals(only PT should resolve) Goal: Pt Will Go Supine/Side To Sit Outcome: Progressing Flowsheets (Taken 03/11/2024 1533) Pt will go Supine/Side to Sit: with supervision Goal: Patient Will Transfer Sit To/From Stand Outcome: Progressing Flowsheets (Taken 03/11/2024 1533) Patient will transfer sit to/from stand: with supervision Goal: Pt Will Transfer Bed To Chair/Chair To Bed Outcome: Progressing Flowsheets (Taken 03/11/2024 1533) Pt will Transfer Bed to Chair/Chair to Bed: with supervision Goal: Pt Will Ambulate Outcome: Progressing Flowsheets (Taken 03/11/2024 1533) Pt will Ambulate:  100 feet  with modified independence  with supervision  with rolling walker  with least restrictive assistive device   3:34 PM, 03/11/24 Lynwood Music, MPT Physical Therapist with [facility] [phone] office [phone] mobile phone  "

## 2024-03-11 NOTE — Evaluation (Signed)
 Physical Therapy Evaluation Patient Details Name: Nathaniel [name] MRN: [id number] DOB: September 24, 1970 Today's Date: 03/11/2024  History of Present Illness  Nathaniel [name] is a 54 y.o. male with medical history significant for COPD, morbid obesity, heroin use disorder.  Patient was brought to the ED via EMS for reports of altered mental status.  Patient was found lying facedown on the floor by his brother.  It is unknown how long patient had been on the floor.    At the time of my evaluation, patient is quite somnolent, he awakens and is able to tell me his name otherwise drifts back to sleep.   Clinical Impression  Patient demonstrates slow labored movement for sitting up at bedside, poor standing balance completing sit to stands without AD, required use of RW for safety and tolerated taking steps forward/backwards at bedside before having to sit due to c/o fatigue. Patient tolerated sitting up in chair after therapy with SpO2 at 922% while on 4 LPM O2 - nurse notified. Patient will benefit from continued skilled physical therapy in hospital and recommended venue below to increase strength, balance, endurance for safe ADLs and gait.           If plan is discharge home, recommend the following: A little help with bathing/dressing/bathroom;Help with stairs or ramp for entrance;Assist for transportation;Assistance with cooking/housework;A lot of help with walking and/or transfers   Can travel by private vehicle   Yes    Equipment Recommendations Rolling walker (2 wheels)  Recommendations for Other Services       Functional Status Assessment Patient has had a recent decline in their functional status and demonstrates the ability to make significant improvements in function in a reasonable and predictable amount of time.     Precautions / Restrictions Precautions Precautions: Fall Recall of Precautions/Restrictions: Intact Restrictions Weight Bearing Restrictions Per Provider Order: No       Mobility  Bed Mobility Overal bed mobility: Needs Assistance Bed Mobility: Supine to Sit     Supine to sit: Min assist, Mod assist     General bed mobility comments: increased time, labored movement    Transfers Overall transfer level: Needs assistance Equipment used: Rolling walker (2 wheels), None, 1 person hand held assist Transfers: Sit to/from Stand, Bed to chair/wheelchair/BSC Sit to Stand: Min assist   Step pivot transfers: Min assist       General transfer comment: unsteady on feet with poor standing balance without AD, required use of RW for safety    Ambulation/Gait Ambulation/Gait assistance: Min assist, Mod assist Gait Distance (Feet): 10 Feet Assistive device: Rolling walker (2 wheels) Gait Pattern/deviations: Decreased step length - right, Decreased step length - left, Decreased stride length Gait velocity: slow     General Gait Details: limited to a few steps forward/backwards at bedside before having to sit due to fatigue  Stairs            Wheelchair Mobility     Tilt Bed    Modified Rankin (Stroke Patients Only)       Balance Overall balance assessment: Needs assistance Sitting-balance support: Feet supported, No upper extremity supported Sitting balance-Leahy Scale: Fair Sitting balance - Comments: seated at EOB   Standing balance support: Reliant on assistive device for balance, During functional activity, No upper extremity supported Standing balance-Leahy Scale: Poor Standing balance comment: fair/poor using RW  Pertinent Vitals/Pain Pain Assessment Pain Assessment: No/denies pain    Home Living Family/patient expects to be discharged to:: Private residence Living Arrangements: Alone Available Help at Discharge: Family;Available PRN/intermittently Type of Home: House Home Access: Level entry       Home Layout: One level Home Equipment: Shower seat - built in;Grab bars -  tub/shower;Rolling Environmental Consultant (2 wheels) Additional Comments: Son works and not available to help per pt    Prior Function Prior Level of Function : Independent/Modified Independent             Mobility Comments: household and community ambulation without AD ADLs Comments: Independent houshold, assisted for community ADLs     Extremity/Trunk Assessment   Upper Extremity Assessment Upper Extremity Assessment: Generalized weakness    Lower Extremity Assessment Lower Extremity Assessment: Generalized weakness    Cervical / Trunk Assessment Cervical / Trunk Assessment: Kyphotic  Communication   Communication Communication: No apparent difficulties    Cognition Arousal: Alert Behavior During Therapy: WFL for tasks assessed/performed                             Following commands: Intact       Cueing Cueing Techniques: Verbal cues, Tactile cues     General Comments      Exercises     Assessment/Plan    PT Assessment Patient needs continued PT services  PT Problem List Decreased strength;Decreased activity tolerance;Decreased balance;Decreased mobility       PT Treatment Interventions DME instruction;Gait training;Stair training;Functional mobility training;Therapeutic activities;Therapeutic exercise;Balance training;Patient/family education    PT Goals (Current goals can be found in the Care Plan section)  Acute Rehab PT Goals Patient Stated Goal: return home with family to assist PT Goal Formulation: With patient Time For Goal Achievement: 03/18/24 Potential to Achieve Goals: Good    Frequency Min 3X/week     Co-evaluation               AM-PAC PT 6 Clicks Mobility  Outcome Measure Help needed turning from your back to your side while in a flat bed without using bedrails?: A Little Help needed moving from lying on your back to sitting on the side of a flat bed without using bedrails?: A Lot Help needed moving to and from a bed to a  chair (including a wheelchair)?: A Little Help needed standing up from a chair using your arms (e.g., wheelchair or bedside chair)?: A Little Help needed to walk in hospital room?: A Lot Help needed climbing 3-5 steps with a railing? : A Lot 6 Click Score: 15    End of Session Equipment Utilized During Treatment: Oxygen Activity Tolerance: Patient tolerated treatment well;Patient limited by fatigue Patient left: in chair;with call bell/phone within reach Nurse Communication: Mobility status PT Visit Diagnosis: Unsteadiness on feet (R26.81);Other abnormalities of gait and mobility (R26.89);Muscle weakness (generalized) (M62.81)    Time: 8873-8843 PT Time Calculation (min) (ACUTE ONLY): 30 min   Charges:   PT Evaluation $PT Eval Moderate Complexity: 1 Mod PT Treatments $Therapeutic Activity: 23-37 mins PT General Charges $$ ACUTE PT VISIT: 1 Visit         3:32 PM, 03/11/24 Lynwood Music, MPT Physical Therapist with [facility] [phone] office [phone] mobile phone

## 2024-03-11 NOTE — Progress Notes (Addendum)
 "      Virtual Visit via Video Note  I connected with Lee Kuang Mcgovern on 03/11/24 at  110PM by a video enabled telemedicine application and verified that I am speaking with the correct person using two identifiers.  Location: [facility]: [facility] Provider: Darryle Law   I discussed the limitations of evaluation and management by telemedicine and the availability of in person appointments. The patient expressed understanding and agreed to proceed.   Subjective: No new complaints   Antibiotics:  Anti-infectives (From admission, onward)    Start     Dose/Rate Route Frequency Ordered Stop   03/10/24 1300  penicillin  G potassium 24 Million Units in dextrose  5 % 500 mL CONTINUOUS infusion        24 Million Units 20.8 mL/hr over 24 Hours Intravenous Every 24 hours 03/10/24 0909     03/09/24 1300  cefTRIAXone  (ROCEPHIN ) 2 g in sodium chloride  0.9 % 100 mL IVPB  Status:  Discontinued        2 g 200 mL/hr over 30 Minutes Intravenous Every 24 hours 03/08/24 2122 03/10/24 0909   03/09/24 0200  vancomycin  (VANCOREADY) IVPB 1250 mg/250 mL  Status:  Discontinued        1,250 mg 166.7 mL/hr over 90 Minutes Intravenous Every 12 hours 03/08/24 2155 03/10/24 0909   03/08/24 1315  vancomycin  (VANCOREADY) IVPB 2000 mg/400 mL        2,000 mg 200 mL/hr over 120 Minutes Intravenous  Once 03/08/24 1301 03/08/24 1546   03/08/24 1300  ceFEPIme  (MAXIPIME ) 2 g in sodium chloride  0.9 % 100 mL IVPB        2 g 200 mL/hr over 30 Minutes Intravenous  Once 03/08/24 1259 03/08/24 1343   03/08/24 1300  vancomycin  (VANCOCIN ) IVPB 1000 mg/200 mL premix  Status:  Discontinued        1,000 mg 200 mL/hr over 60 Minutes Intravenous  Once 03/08/24 1259 03/08/24 1300       Medications: Scheduled Meds:  Chlorhexidine  Gluconate Cloth  6 each Topical Daily   enoxaparin  (LOVENOX ) injection  40 mg Subcutaneous Q24H   fluticasone  furoate-vilanterol  1 puff Inhalation Daily   mupirocin  ointment   Nasal BID    pantoprazole   40 mg Oral Daily   Continuous Infusions:  penicillin  G potassium 24 Million Units in dextrose  5 % 500 mL CONTINUOUS infusion 20.8 mL/hr at 03/11/24 1242   PRN Meds:.acetaminophen  **OR** acetaminophen , ipratropium-albuterol , magnesium  hydroxide, naLOXone  (NARCAN )  injection, ondansetron  **OR** ondansetron  (ZOFRAN ) IV, polyethylene glycol    Objective: Weight change:   Intake/Output Summary (Last 24 hours) at 03/11/2024 1320 Last data filed at 03/11/2024 1244 Gross per 24 hour  Intake 275.16 ml  Output 1600 ml  Net -1324.84 ml   Blood pressure 124/71, pulse 81, temperature 97.6 F (36.4 C), temperature source Axillary, resp. rate 20, height 6' (1.829 m), weight (!) 145 kg, SpO2 92%. Temp:  [97 F (36.1 C)-98.3 F (36.8 C)] 97.6 F (36.4 C) (02/06 1148) Pulse Rate:  [69-95] 81 (02/06 1154) Resp:  [10-29] 20 (02/06 1000) BP: (101-166)/(52-84) 124/71 (02/06 1154) SpO2:  [90 %-100 %] 92 % (02/06 1154) FiO2 (%):  [40 %-44 %] 40 % (02/05 2300)  Physical Exam: Physical Exam Constitutional:      Appearance: Normal appearance. He is obese.  HENT:     Head: Normocephalic and atraumatic.  Eyes:     General:        Right eye: No discharge.  Left eye: No discharge.     Extraocular Movements: Extraocular movements intact.     Pupils: Pupils are equal, round, and reactive to light.  Cardiovascular:     Rate and Rhythm: Normal rate and regular rhythm.  Pulmonary:     Effort: No respiratory distress.     Breath sounds: No wheezing.  Abdominal:     General: There is no distension.  Musculoskeletal:     Cervical back: Normal range of motion and neck supple.  Skin:    General: Skin is warm and dry.  Neurological:     General: No focal deficit present.     Mental Status: He is alert and oriented to person, place, and time.  Psychiatric:        Mood and Affect: Mood normal.        Behavior: Behavior normal.        Thought Content: Thought content normal.         Judgment: Judgment normal.     Left leg appears on video feed to be less erythematous  CBC:    BMET Recent Labs    03/10/24 0456 03/11/24 0450  NA 137 141  K 3.6 4.0  CL 96* 98  CO2 29 36*  GLUCOSE 127* 112*  BUN 24* 23*  CREATININE 0.80 0.77  CALCIUM 8.0* 8.1*     Liver Panel  No results for input(s): PROT, ALBUMIN, AST, ALT, ALKPHOS, BILITOT, BILIDIR, IBILI in the last 72 hours.     Sedimentation Rate Recent Labs    03/11/24 0450  ESRSEDRATE 66*   C-Reactive Protein Recent Labs    03/11/24 0450  CRP 14.2*    Micro Results: Recent Results (from the past 720 hours)  Blood culture (routine x 2)     Status: Abnormal   Collection Time: 03/08/24 12:40 PM   Specimen: BLOOD  Result Value Ref Range Status   Specimen Description   Final    BLOOD BLOOD RIGHT ARM Performed at [facility], [address], [place], [place] [address]    Special Requests   Final    Blood Culture adequate volume BOTTLES DRAWN AEROBIC AND ANAEROBIC Performed at [facility], [address], [place], [place] [address]    Culture  Setup Time   Final    GRAM POSITIVE COCCI IN PAIRS AND CHAINS IN BOTH AEROBIC AND ANAEROBIC BOTTLES CRITICAL VALUE NOTED.  VALUE IS CONSISTENT WITH PREVIOUSLY REPORTED AND CALLED VALUE.    Culture (A)  Final    STREPTOCOCCUS GROUP G SUSCEPTIBILITIES PERFORMED ON PREVIOUS CULTURE WITHIN THE LAST 5 DAYS. Performed at [facility], [address], [place], [place] [address]    Report Status 03/11/2024 FINAL  Final  Blood culture (routine x 2)     Status: Abnormal   Collection Time: 03/08/24 12:41 PM   Specimen: BLOOD  Result Value Ref Range Status   Specimen Description   Final    BLOOD BLOOD LEFT ARM Performed at [facility], [address] [place], [place] [address]    Special Requests   Final    BOTTLES DRAWN AEROBIC AND ANAEROBIC Blood Culture adequate volume Performed at [facility], [address] [place], [place]  [address]    Culture  Setup Time   Final    GRAM POSITIVE COCCI IN PAIRS AND CHAINS IN BOTH AEROBIC AND ANAEROBIC BOTTLES Gram Stain Report Called to,Read Back By and Verified With: R THOMPSON,RN@0015  03/09/24 MK CRITICAL RESULT CALLED TO, READ BACK BY AND VERIFIED  WITH: COURTNEY P FLOOR RN  03/09/2024 BY DD @ 0505    Culture (A)  Final    STREPTOCOCCUS GROUP G STAPHYLOCOCCUS EPIDERMIDIS THE SIGNIFICANCE OF ISOLATING THIS ORGANISM FROM A SINGLE SET OF BLOOD CULTURES WHEN MULTIPLE SETS ARE DRAWN IS UNCERTAIN. PLEASE NOTIFY THE MICROBIOLOGY DEPARTMENT WITHIN ONE WEEK IF SPECIATION AND SENSITIVITIES ARE REQUIRED. Performed at [facility], [address], [place], [place] [address]    Report Status 03/11/2024 FINAL  Final   Organism ID, Bacteria STREPTOCOCCUS GROUP G  Final      Susceptibility   Streptococcus group g - MIC*    CLINDAMYCIN >=1 RESISTANT Resistant     AMPICILLIN <=0.25 SENSITIVE Sensitive     ERYTHROMYCIN >=8 RESISTANT Resistant     VANCOMYCIN  0.5 SENSITIVE Sensitive     CEFTRIAXONE  <=0.12 SENSITIVE Sensitive     LEVOFLOXACIN 0.5 SENSITIVE Sensitive     PENICILLIN  <=0.06 SENSITIVE Sensitive     * STREPTOCOCCUS GROUP G  Blood Culture ID Panel (Reflexed)     Status: Abnormal   Collection Time: 03/08/24 12:41 PM  Result Value Ref Range Status   Enterococcus faecalis NOT DETECTED NOT DETECTED Final   Enterococcus Faecium NOT DETECTED NOT DETECTED Final   Listeria monocytogenes NOT DETECTED NOT DETECTED Final   Staphylococcus species DETECTED (A) NOT DETECTED Final    Comment: CRITICAL RESULT CALLED TO, READ BACK BY AND VERIFIED WITH: COURTNEY P FLOOR RN  03/09/2024 BY DD @ 0505    Staphylococcus aureus (BCID) NOT DETECTED NOT DETECTED Final   Staphylococcus epidermidis DETECTED (A) NOT DETECTED Final    Comment: Methicillin (oxacillin) resistant coagulase negative staphylococcus. Possible blood culture contaminant (unless isolated from more than one blood culture draw or  clinical case suggests pathogenicity). No antibiotic treatment is indicated for blood  culture contaminants. CRITICAL RESULT CALLED TO, READ BACK BY AND VERIFIED WITH: COURTNEY P FLOOR RN  03/09/2024 BY DD @ 0505    Staphylococcus lugdunensis NOT DETECTED NOT DETECTED Final   Streptococcus species DETECTED (A) NOT DETECTED Final    Comment: Not Enterococcus species, Streptococcus agalactiae, Streptococcus pyogenes, or Streptococcus pneumoniae. CRITICAL RESULT CALLED TO, READ BACK BY AND VERIFIED WITH: COURTNEY P FLOOR RN  03/09/2024 BY DD @ 0505    Streptococcus agalactiae NOT DETECTED NOT DETECTED Final   Streptococcus pneumoniae NOT DETECTED NOT DETECTED Final   Streptococcus pyogenes NOT DETECTED NOT DETECTED Final   A.calcoaceticus-baumannii NOT DETECTED NOT DETECTED Final   Bacteroides fragilis NOT DETECTED NOT DETECTED Final   Enterobacterales NOT DETECTED NOT DETECTED Final   Enterobacter cloacae complex NOT DETECTED NOT DETECTED Final   Escherichia coli NOT DETECTED NOT DETECTED Final   Klebsiella aerogenes NOT DETECTED NOT DETECTED Final   Klebsiella oxytoca NOT DETECTED NOT DETECTED Final   Klebsiella pneumoniae NOT DETECTED NOT DETECTED Final   Proteus species NOT DETECTED NOT DETECTED Final   Salmonella species NOT DETECTED NOT DETECTED Final   Serratia marcescens NOT DETECTED NOT DETECTED Final   Haemophilus influenzae NOT DETECTED NOT DETECTED Final   Neisseria meningitidis NOT DETECTED NOT DETECTED Final   Pseudomonas aeruginosa NOT DETECTED NOT DETECTED Final   Stenotrophomonas maltophilia NOT DETECTED NOT DETECTED Final   Candida albicans NOT DETECTED NOT DETECTED Final   Candida auris NOT DETECTED NOT DETECTED Final   Candida glabrata NOT DETECTED NOT DETECTED Final   Candida krusei NOT DETECTED NOT DETECTED Final   Candida parapsilosis NOT DETECTED NOT DETECTED Final   Candida tropicalis NOT DETECTED NOT DETECTED  Final   Cryptococcus neoformans/gattii NOT DETECTED  NOT DETECTED Final   Methicillin resistance mecA/C DETECTED (A) NOT DETECTED Final    Comment: CRITICAL RESULT CALLED TO, READ BACK BY AND VERIFIED WITH: COURTNEY P FLOOR RN  03/09/2024 BY DD @ 0505 Performed at [facility], [address], [place], [place] [address]   Urine Culture (for pregnant, neutropenic or urologic patients or patients with an indwelling urinary catheter)     Status: Abnormal   Collection Time: 03/08/24 12:48 PM   Specimen: Urine, Clean Catch  Result Value Ref Range Status   Specimen Description   Final    URINE, CLEAN CATCH Performed at [facility], [address], [place], [place] [address]    Special Requests   Final    NONE Performed at [facility], [address], [place], [place] [address]    Culture (A)  Final    <10,000 COLONIES/mL INSIGNIFICANT GROWTH Performed at [facility], [address] [place], [place] [address]    Report Status 03/10/2024 FINAL  Final  Resp panel by RT-PCR (RSV, Flu A&B, Covid) Anterior Nasal Swab     Status: None   Collection Time: 03/08/24  1:56 PM   Specimen: Anterior Nasal Swab  Result Value Ref Range Status   SARS Coronavirus 2 by RT PCR NEGATIVE NEGATIVE Final    Comment: (NOTE) SARS-CoV-2 target nucleic acids are NOT DETECTED.  The SARS-CoV-2 RNA is generally detectable in upper respiratory specimens during the acute phase of infection. The lowest concentration of SARS-CoV-2 viral copies this assay can detect is 138 copies/mL. A negative result does not preclude SARS-Cov-2 infection and should not be used as the sole basis for treatment or other patient management decisions. A negative result may occur with  improper specimen collection/handling, submission of specimen other than nasopharyngeal swab, presence of viral mutation(s) within the areas targeted by this assay, and inadequate number of viral copies(<138 copies/mL). A negative result must be combined with clinical observations, patient  history, and epidemiological information. The expected result is Negative.  Fact Sheet for Patients:  bloggercourse.com  Fact Sheet for Healthcare Providers:  seriousbroker.it  This test is no t yet approved or cleared by the [place]  FDA and  has been authorized for detection and/or diagnosis of SARS-CoV-2 by FDA under an Emergency Use Authorization (EUA). This EUA will remain  in effect (meaning this test can be used) for the duration of the COVID-19 declaration under Section 564(b)(1) of the Act, 21 U.S.C.section 360bbb-3(b)(1), unless the authorization is terminated  or revoked sooner.       Influenza A by PCR NEGATIVE NEGATIVE Final   Influenza B by PCR NEGATIVE NEGATIVE Final    Comment: (NOTE) The Xpert Xpress SARS-CoV-2/FLU/RSV plus assay is intended as an aid in the diagnosis of influenza from Nasopharyngeal swab specimens and should not be used as a sole basis for treatment. Nasal washings and aspirates are unacceptable for Xpert Xpress SARS-CoV-2/FLU/RSV testing.  Fact Sheet for Patients: bloggercourse.com  Fact Sheet for Healthcare Providers: seriousbroker.it  This test is not yet approved or cleared by the [place]  FDA and has been authorized for detection and/or diagnosis of SARS-CoV-2 by FDA under an Emergency Use Authorization (EUA). This EUA will remain in effect (meaning this test can be used) for the duration of the COVID-19 declaration under Section 564(b)(1) of the Act, 21 U.S.C. section 360bbb-3(b)(1), unless the authorization is terminated or revoked.     Resp Syncytial Virus by PCR  NEGATIVE NEGATIVE Final    Comment: (NOTE) Fact Sheet for Patients: bloggercourse.com  Fact Sheet for Healthcare Providers: seriousbroker.it  This test is not yet approved or cleared by the [place]  FDA  and has been authorized for detection and/or diagnosis of SARS-CoV-2 by FDA under an Emergency Use Authorization (EUA). This EUA will remain in effect (meaning this test can be used) for the duration of the COVID-19 declaration under Section 564(b)(1) of the Act, 21 U.S.C. section 360bbb-3(b)(1), unless the authorization is terminated or revoked.  Performed at [facility], [address] [place], [place] [address]   MRSA Next Gen by PCR, Nasal     Status: Abnormal   Collection Time: 03/09/24  3:17 PM   Specimen: Nasal Mucosa; Nasal Swab  Result Value Ref Range Status   MRSA by PCR Next Gen DETECTED (A) NOT DETECTED Final    Comment: RESULT CALLED TO, READ BACK BY AND VERIFIED WITH: CASSIDY COOK @ 1847 ON 03/09/24 C VARNER (NOTE) The GeneXpert MRSA Assay (FDA approved for NASAL specimens only), is one component of a comprehensive MRSA colonization surveillance program. It is not intended to diagnose MRSA infection nor to guide or monitor treatment for MRSA infections. Test performance is not FDA approved in patients less than 27 years old. Performed at [facility], [address], [place], [place] [address]   Culture, blood (Routine X 2) w Reflex to ID Panel     Status: None (Preliminary result)   Collection Time: 03/10/24  9:33 AM   Specimen: BLOOD  Result Value Ref Range Status   Specimen Description BLOOD BLOOD RIGHT HAND  Final   Special Requests   Final    Blood Culture adequate volume BOTTLES DRAWN AEROBIC AND ANAEROBIC   Culture   Final    NO GROWTH < 24 HOURS Performed at [facility], [address], [place], [place] [address]    Report Status PENDING  Incomplete  Culture, blood (Routine X 2) w Reflex to ID Panel     Status: None (Preliminary result)   Collection Time: 03/10/24  9:33 AM   Specimen: BLOOD  Result Value Ref Range Status   Specimen Description BLOOD BLOOD LEFT HAND  Final   Special Requests   Final    Blood Culture adequate volume BOTTLES DRAWN AEROBIC  AND ANAEROBIC   Culture   Final    NO GROWTH < 24 HOURS Performed at [facility], [address], [place], [place] [address]    Report Status PENDING  Incomplete    Studies/Results: ECHOCARDIOGRAM COMPLETE Result Date: 03/09/2024    ECHOCARDIOGRAM REPORT   Patient Name:   EZANA HUBBERT Date of Exam: 03/09/2024 Medical Rec #:  [id number]     Height:       72.0 in Accession #:    [id number]    Weight:       319.7 lb Date of Birth:  [birth year]     BSA:          2.600 m Patient Age:    53 years      BP:           134/84 mmHg Patient Gender: M             HR:           76 bpm. Exam Location:  [facility] Procedure: 2D Echo, Cardiac Doppler, Color Doppler and Intracardiac            Opacification Agent (Both Spectral and Color Flow  Doppler were            utilized during procedure). Indications:    Bacteremia R78.81  History:        Patient has prior history of Echocardiogram examinations, most                 recent 05/22/2023. Risk Factors:Hypertension.  Sonographer:    Sydnee Wilson RDCS Referring Phys: [id number] JESSICA U VANN  Sonographer Comments: Patient is obese. Image acquisition challenging due to patient body habitus. IMPRESSIONS  1. Poor acoustic windows for evaluation of infective endocarditis. Valves not well visualized. Consider TEE for further evaluation, if clincally indicated.  2. No LV thrombus by Definity . Left ventricular ejection fraction, by estimation, is 60 to 65%. The left ventricle has normal function. The left ventricle has no regional wall motion abnormalities. Left ventricular diastolic parameters were normal.  3. Right ventricular systolic function is normal. The right ventricular size is normal. Tricuspid regurgitation signal is inadequate for assessing PA pressure.  4. The mitral valve was not well visualized. No evidence of mitral valve regurgitation. No evidence of mitral stenosis.  5. The aortic valve was not well visualized. Aortic valve regurgitation is not visualized. No aortic  stenosis is present.  6. The inferior vena cava is dilated in size with <50% respiratory variability, suggesting right atrial pressure of 15 mmHg. FINDINGS  Left Ventricle: No LV thrombus by Definity . Left ventricular ejection fraction, by estimation, is 60 to 65%. The left ventricle has normal function. The left ventricle has no regional wall motion abnormalities. Definity  contrast agent was given IV to delineate the left ventricular endocardial borders. Strain was performed and the global longitudinal strain is indeterminate. The left ventricular internal cavity size was normal in size. There is no left ventricular hypertrophy. Left ventricular diastolic parameters were normal. Right Ventricle: The right ventricular size is normal. No increase in right ventricular wall thickness. Right ventricular systolic function is normal. Tricuspid regurgitation signal is inadequate for assessing PA pressure. Left Atrium: Left atrial size was normal in size. Right Atrium: Right atrial size was normal in size. Pericardium: There is no evidence of pericardial effusion. Mitral Valve: The mitral valve was not well visualized. No evidence of mitral valve regurgitation. No evidence of mitral valve stenosis. Tricuspid Valve: The tricuspid valve is not well visualized. Tricuspid valve regurgitation is not demonstrated. No evidence of tricuspid stenosis. Aortic Valve: The aortic valve was not well visualized. Aortic valve regurgitation is not visualized. No aortic stenosis is present. Aortic valve mean gradient measures 5.0 mmHg. Aortic valve peak gradient measures 9.4 mmHg. Aortic valve area, by VTI measures 3.00 cm. Pulmonic Valve: The pulmonic valve was not well visualized. Pulmonic valve regurgitation is not visualized. No evidence of pulmonic stenosis. Aorta: The aortic root is normal in size and structure. Venous: The inferior vena cava is dilated in size with less than 50% respiratory variability, suggesting right atrial  pressure of 15 mmHg. IAS/Shunts: No atrial level shunt detected by color flow Doppler. Additional Comments: 3D was performed not requiring image post processing on an independent workstation and was indeterminate.  LEFT VENTRICLE PLAX 2D LVIDd:         6.10 cm      Diastology LVIDs:         4.30 cm      LV e' medial:    11.40 cm/s LV PW:         1.10 cm      LV E/e' medial:  6.7 LV  IVS:        1.10 cm      LV e' lateral:   14.10 cm/s LVOT diam:     2.20 cm      LV E/e' lateral: 5.4 LV SV:         84 LV SV Index:   32 LVOT Area:     3.80 cm  LV Volumes (MOD) LV vol d, MOD A2C: 229.0 ml LV vol d, MOD A4C: 294.0 ml LV vol s, MOD A2C: 64.3 ml LV vol s, MOD A4C: 99.5 ml LV SV MOD A2C:     164.7 ml LV SV MOD A4C:     294.0 ml LV SV MOD BP:      177.9 ml RIGHT VENTRICLE RV S prime:     15.60 cm/s TAPSE (M-mode): 1.7 cm LEFT ATRIUM             Index        RIGHT ATRIUM           Index LA diam:        4.00 cm 1.54 cm/m   RA Area:     13.80 cm LA Vol (A2C):   48.1 ml 18.50 ml/m  RA Volume:   31.00 ml  11.92 ml/m LA Vol (A4C):   50.0 ml 19.23 ml/m LA Biplane Vol: 49.5 ml 19.03 ml/m  AORTIC VALVE AV Area (Vmax):    2.56 cm AV Area (Vmean):   2.73 cm AV Area (VTI):     3.00 cm AV Vmax:           153.00 cm/s AV Vmean:          103.000 cm/s AV VTI:            0.281 m AV Peak Grad:      9.4 mmHg AV Mean Grad:      5.0 mmHg LVOT Vmax:         103.00 cm/s LVOT Vmean:        73.900 cm/s LVOT VTI:          0.222 m LVOT/AV VTI ratio: 0.79  AORTA Ao Root diam: 3.20 cm Ao Asc diam:  3.00 cm MITRAL VALVE MV Area (PHT): 3.34 cm    SHUNTS MV Decel Time: 227 msec    Systemic VTI:  0.22 m MV E velocity: 76.30 cm/s  Systemic Diam: 2.20 cm MV A velocity: 64.90 cm/s MV E/A ratio:  1.18 Vishnu Priya Mallipeddi Electronically signed by Diannah Late Mallipeddi Signature Date/Time: 03/09/2024/2:36:57 PM    Final       Assessment/Plan:  INTERVAL HISTORY:  repeat blood cultures no growth   Principal Problem:   Group G streptococcal  infection Active Problems:   COPD (chronic obstructive pulmonary disease) (HCC)   Lymphedema of both lower extremities   Morbid obesity with BMI of 40.0-44.9, adult (HCC)   Acute respiratory failure (HCC)   Severe sepsis (HCC)   Cellulitis of left lower extremity   Acute metabolic encephalopathy   Hypokalemia   Lymphadenopathy   Altered mental status   Cervical spinal stenosis   Non-traumatic rhabdomyolysis   Splenomegaly   Acute hypoxic respiratory failure (HCC)    TOMA ARTS is a 54 y.o. 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 , COPD morbid obesity, lymphedema, venous stasis dermatitis admitted with septic shock secondary to Group G streptococcal bacteremia from a cellultiis  He should not have endocarditis and I would not pursue a TEE  He did have some  C spine stenosis but not complaining of neck pain or focal UE weakness  I would therefore continue PCN IV as an inpatient  Elevate the LEFT leg above the heart as much as possible  When he nears DC would change him to 2x 500mg  = 1000mg  Amoxicillin to complete at total of 2 weeks of effective antibiotics  Rhabdo: resolving  Hepatitis and HIV screening: all tests negative so far  Splenomegaly: defer to outpatient workup   CRITICAL CARE Performed by: Jomarie [facility]   Total critical care time: 30 minutes  Critical care time was exclusive of separately billable procedures and treating other patients.  Critical care was necessary to treat or prevent imminent or life-threatening deterioration.  Critical care was time spent personally by me on the following activities: development of treatment plan with patient and/or surrogate as well as nursing, discussions with consultants, evaluation of patient's response to treatment, examination of patient, obtaining history from patient or surrogate, ordering and performing treatments and interventions, ordering and review of laboratory studies, ordering and review of radiographic  studies, pulse oximetry and re-evaluation of patient's condition.   Evaluation of the patient requires complex antimicrobial therapy evaluation, counseling , isolation needs to reduce disease transmission and risk assessment and mitigation.   I will sign off for now.  Please call with further questions.   LOS: 3 days   Jomarie [facility]inas Rothman 03/11/2024, 1:20 PM  "

## 2024-03-11 NOTE — Care Management Important Message (Deleted)
 Important Message  Patient Details  Name: Nathaniel [name] MRN: [id number] Date of Birth: 18-May-1970   Important Message Given:  Yes - Medicare IM     [name] [name] 03/11/2024, 11:15 AM
